# Patient Record
Sex: Male | Born: 1965 | Race: White | Hispanic: No | Marital: Married | Smoking: Never smoker
Health system: Southern US, Community
[De-identification: ages and names within clinical notes are randomized; demographics above are authoritative.]

## PROBLEM LIST (undated history)

## (undated) ENCOUNTER — Ambulatory Visit: Admission: EM | Payer: Managed Care, Other (non HMO)

## (undated) DIAGNOSIS — F32A Depression, unspecified: Secondary | ICD-10-CM

## (undated) DIAGNOSIS — F419 Anxiety disorder, unspecified: Secondary | ICD-10-CM

## (undated) DIAGNOSIS — K649 Unspecified hemorrhoids: Secondary | ICD-10-CM

## (undated) DIAGNOSIS — G4730 Sleep apnea, unspecified: Secondary | ICD-10-CM

## (undated) DIAGNOSIS — F329 Major depressive disorder, single episode, unspecified: Secondary | ICD-10-CM

## (undated) DIAGNOSIS — E785 Hyperlipidemia, unspecified: Secondary | ICD-10-CM

## (undated) DIAGNOSIS — T7840XA Allergy, unspecified, initial encounter: Secondary | ICD-10-CM

## (undated) HISTORY — DX: Hyperlipidemia, unspecified: E78.5

## (undated) HISTORY — DX: Major depressive disorder, single episode, unspecified: F32.9

## (undated) HISTORY — DX: Depression, unspecified: F32.A

## (undated) HISTORY — DX: Unspecified hemorrhoids: K64.9

## (undated) HISTORY — DX: Anxiety disorder, unspecified: F41.9

## (undated) HISTORY — DX: Allergy, unspecified, initial encounter: T78.40XA

---

## 2008-07-15 ENCOUNTER — Emergency Department: Payer: Self-pay | Admitting: Emergency Medicine

## 2015-01-30 ENCOUNTER — Telehealth: Payer: Self-pay | Admitting: Family Medicine

## 2015-01-30 NOTE — Telephone Encounter (Signed)
Patient did not answer, lvm to call back if he still needs information

## 2015-02-01 ENCOUNTER — Telehealth: Payer: Self-pay

## 2015-02-01 NOTE — Telephone Encounter (Signed)
Patient called, he had CPE in May.  Now has insurance form to have filled out.  He will come in for  Nicotine/Cotine and A1C drawn Z02.6 Patient will leave paperwork to be filled out and signed when labs have resulted  [place] to [place]

## 2015-02-11 ENCOUNTER — Other Ambulatory Visit: Payer: Self-pay

## 2015-02-13 ENCOUNTER — Other Ambulatory Visit: Payer: 59

## 2015-02-13 DIAGNOSIS — Z026 Encounter for examination for insurance purposes: Secondary | ICD-10-CM

## 2015-02-14 LAB — HGB A1C W/O EAG: HEMOGLOBIN A1C: 5.9 % — AB (ref 4.8–5.6)

## 2015-02-16 LAB — NICOTINE/COTININE METABOLITES
Cotinine: NOT DETECTED ng/mL
Nicotine: NOT DETECTED ng/mL

## 2015-02-25 ENCOUNTER — Telehealth: Payer: Self-pay

## 2015-02-25 NOTE — Telephone Encounter (Signed)
Pt requests call back about labs and paperwork he stated you were supposed to fax for his employer and insurance company. Please fax documents (Manual submission form and full lab report) to fax # [phone]. Please call pt ASAP. Pt can be reached at [phone]. Thanks.

## 2015-05-28 DIAGNOSIS — E785 Hyperlipidemia, unspecified: Secondary | ICD-10-CM | POA: Insufficient documentation

## 2015-05-29 ENCOUNTER — Other Ambulatory Visit: Payer: Self-pay | Admitting: Family Medicine

## 2015-05-29 MED ORDER — FEXOFENADINE-PSEUDOEPHED ER 60-120 MG PO TB12: 1.0000 | ORAL_TABLET | Freq: Every day | ORAL | Status: DC | PRN

## 2015-11-12 ENCOUNTER — Other Ambulatory Visit: Payer: Self-pay | Admitting: Family Medicine

## 2015-11-12 ENCOUNTER — Encounter: Payer: Self-pay | Admitting: Family Medicine

## 2015-11-12 NOTE — Telephone Encounter (Signed)
apt 

## 2015-12-12 ENCOUNTER — Encounter: Payer: Self-pay | Admitting: Family Medicine

## 2015-12-12 ENCOUNTER — Ambulatory Visit (INDEPENDENT_AMBULATORY_CARE_PROVIDER_SITE_OTHER): Payer: 59 | Admitting: Family Medicine

## 2015-12-12 VITALS — BP 109/74 | HR 71 | Temp 97.9°F | Ht 68.2 in | Wt 194.0 lb

## 2015-12-12 DIAGNOSIS — T7840XA Allergy, unspecified, initial encounter: Secondary | ICD-10-CM | POA: Insufficient documentation

## 2015-12-12 DIAGNOSIS — E785 Hyperlipidemia, unspecified: Secondary | ICD-10-CM

## 2015-12-12 DIAGNOSIS — Q828 Other specified congenital malformations of skin: Secondary | ICD-10-CM | POA: Diagnosis not present

## 2015-12-12 DIAGNOSIS — T7840XD Allergy, unspecified, subsequent encounter: Secondary | ICD-10-CM

## 2015-12-12 DIAGNOSIS — Z789 Other specified health status: Secondary | ICD-10-CM | POA: Diagnosis not present

## 2015-12-12 MED ORDER — FEXOFENADINE-PSEUDOEPHED ER 60-120 MG PO TB12: 1.0000 | ORAL_TABLET | Freq: Every day | ORAL | Status: DC | PRN

## 2015-12-12 MED ORDER — LOVASTATIN 40 MG PO TABS: 40.0000 mg | ORAL_TABLET | Freq: Every day | ORAL | Status: DC

## 2015-12-12 NOTE — Progress Notes (Signed)
BP 109/74 mmHg  Pulse 71  Temp(Src) 97.9 F (36.6 C)  Ht 5' 8.2" (1.732 m)  Wt 194 lb (87.998 kg)  BMI 29.33 kg/m2  SpO2 96%   Subjective:    Patient ID: Julian Wolfe, male    DOB: [birth year], 50 y.o.   MRN: [id number]  HPI: Julian Wolfe is a 50 y.o. male  Chief Complaint  Patient presents with  . Hyperlipidemia  . "flaky spot on back"  . skin tag on left eye  . Hernia  . wants to check PSA, A1C, Glucose and Cotinine    Wellness labs for insurance, can come back in the morning   Patient doing well taking cholesterol medicines without side effects and faithfully Allergy medicines doing well uses when necessary but needs a refill Skin tags on left eye and several on his back will scratching bleed Has occasional hernia which is been previously diagnosed that's sometimes uncomfortable but not enough to see a surgeon for at this point  Relevant past medical, surgical, family and social history reviewed and updated as indicated. Interim medical history since our last visit reviewed. Allergies and medications reviewed and updated.  Review of Systems  Constitutional: Negative.   Respiratory: Negative.   Cardiovascular: Negative.     Per HPI unless specifically indicated above     Objective:    BP 109/74 mmHg  Pulse 71  Temp(Src) 97.9 F (36.6 C)  Ht 5' 8.2" (1.732 m)  Wt 194 lb (87.998 kg)  BMI 29.33 kg/m2  SpO2 96%  Wt Readings from Last 3 Encounters:  12/12/15 194 lb (87.998 kg)  11/22/14 190 lb (86.183 kg)    Physical Exam  Constitutional: He is oriented to person, place, and time. He appears well-developed and well-nourished. No distress.  HENT:  Head: Normocephalic and atraumatic.  Right Ear: Hearing normal.  Left Ear: Hearing normal.  Nose: Nose normal.  Eyes: Conjunctivae and lids are normal. Right eye exhibits no discharge. Left eye exhibits no discharge. No scleral icterus.  Cardiovascular: Normal rate, regular rhythm and normal heart sounds.    Pulmonary/Chest: Effort normal and breath sounds normal. No respiratory distress.  Musculoskeletal: Normal range of motion.  Neurological: He is alert and oriented to person, place, and time.  Skin: Skin is intact. No rash noted.  Skin tags left eyelid and mid back both left and right destroyed with cryo-unit patient tolerated procedure well for lesions in all  Psychiatric: He has a normal mood and affect. His speech is normal and behavior is normal. Judgment and thought content normal. Cognition and memory are normal.    Results for orders placed or performed in visit on 02/13/15  Hgb A1c w/o eAG  Result Value Ref Range   Hgb A1c MFr Bld 5.9 (H) 4.8 - 5.6 %  Nicotine/cotinine metabolites  Result Value Ref Range   Nicotine None Detected ng/mL   Cotinine None Detected ng/mL      Assessment & Plan:   Problem List Items Addressed This Visit      Other   Hyperlipidemia - Primary    Checking labs apparently good control      Relevant Medications   lovastatin (MEVACOR) 40 MG tablet   Other Relevant Orders   ALT   AST   Lipid Panel w/o Chol/HDL Ratio   Allergy    Other Visit Diagnoses    Participant in health and wellness plan        Relevant Orders    PSA  Bayer DCA Hb A1c Waived    Nicotine/cotinine metabolites    Accessory skin tags        Skin tags face and back destroyed with cryo-unit for lesions        Follow up plan: Return for Physical Exam.

## 2015-12-12 NOTE — Assessment & Plan Note (Signed)
Checking labs apparently good control

## 2015-12-13 ENCOUNTER — Other Ambulatory Visit: Payer: 59

## 2015-12-13 DIAGNOSIS — E785 Hyperlipidemia, unspecified: Secondary | ICD-10-CM

## 2015-12-13 DIAGNOSIS — Z789 Other specified health status: Secondary | ICD-10-CM

## 2015-12-13 LAB — BAYER DCA HB A1C WAIVED: HB A1C (BAYER DCA - WAIVED): 5.8 %

## 2015-12-18 ENCOUNTER — Telehealth: Payer: Self-pay | Admitting: Family Medicine

## 2015-12-18 DIAGNOSIS — K4090 Unilateral inguinal hernia, without obstruction or gangrene, not specified as recurrent: Secondary | ICD-10-CM

## 2015-12-18 NOTE — Telephone Encounter (Signed)
Pt would like to discuss lab results.

## 2015-12-19 ENCOUNTER — Encounter: Payer: Self-pay | Admitting: Family Medicine

## 2015-12-19 LAB — LIPID PANEL W/O CHOL/HDL RATIO
Cholesterol, Total: 171 mg/dL (ref 100–199)
HDL: 45 mg/dL (ref >39)
LDL CALC: 113 mg/dL — AB (ref 0–99)
TRIGLYCERIDES: 67 mg/dL (ref 0–149)
VLDL Cholesterol Cal: 13 mg/dL (ref 5–40)

## 2015-12-19 LAB — NICOTINE/COTININE METABOLITES
COTININE: NOT DETECTED ng/mL
NICOTINE: NOT DETECTED ng/mL

## 2015-12-19 LAB — PSA: Prostate Specific Ag, Serum: 0.6 ng/mL (ref 0.0–4.0)

## 2015-12-19 LAB — ALT: ALT: 41 IU/L (ref 0–44)

## 2015-12-19 LAB — AST: AST: 24 IU/L (ref 0–40)

## 2015-12-19 NOTE — Telephone Encounter (Signed)
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 Patient's now ready to have hernia repaired will refer to surgery

## 2015-12-23 ENCOUNTER — Encounter: Payer: Self-pay | Admitting: *Deleted

## 2015-12-30 ENCOUNTER — Telehealth: Payer: Self-pay

## 2015-12-30 NOTE — Telephone Encounter (Signed)
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

## 2016-01-02 ENCOUNTER — Encounter: Payer: Self-pay | Admitting: General Surgery

## 2016-01-02 ENCOUNTER — Ambulatory Visit (INDEPENDENT_AMBULATORY_CARE_PROVIDER_SITE_OTHER): Payer: 59 | Admitting: General Surgery

## 2016-01-02 DIAGNOSIS — R1031 Right lower quadrant pain: Secondary | ICD-10-CM | POA: Diagnosis not present

## 2016-01-02 NOTE — Progress Notes (Signed)
Patient ID: Julian Wolfe, male   DOB: 1965-11-18, 50 y.o.   MRN: [id number]  Chief Complaint  Patient presents with  . Abdominal Pain    HPI Julian Wolfe is Wolfe 50 y.o. male.  Here today for evaluation of right lower abdominal pain. He states he has noticed the area for several years. 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  He states there is no visible bulge.   Denies any gastroenterically issues. He does states peanuts, garlic and ice cream bother him. Admits to several bowel movements in the morning time.  He is here today with his wife.   I have reviewed the history of present illness with the patient.  HPI  Past Medical History  Diagnosis Date  . Hyperlipidemia   . Allergy   . Anxiety   . Depression   . Hemorrhoid     History reviewed. No pertinent past surgical history.  Family History  Problem Relation Age of Onset  . Hypertension Mother   . Dementia Mother   . Parkinson's disease Mother   . Cancer Father 19    prostate  . Parkinson's disease Father     Social History Social History  Substance Use Topics  . Smoking status: Never Smoker   . Smokeless tobacco: Former Systems developer    Types: [place] date: 07/21/1995  . Alcohol Use: Yes     Comment: rare    No Known Allergies  Current Outpatient Prescriptions  Medication Sig Dispense Refill  . fexofenadine-pseudoephedrine (ALLEGRA-D) 60-120 MG 12 hr tablet Take 1 tablet by mouth daily as needed. 60 tablet 6  . lovastatin (MEVACOR) 40 MG tablet Take 1 tablet (40 mg total) by mouth daily. 90 tablet 1   No current facility-administered medications for this visit.    Review of Systems Review of Systems  Constitutional: Negative.   Respiratory: Negative.   Cardiovascular: Negative.   Gastrointestinal: Positive for abdominal pain.    Blood pressure 154/90, pulse 72, resp. rate 12, height  '5\' 10"'  (1.778 m), weight 198 lb (89.812 kg).  Physical Exam Physical Exam  Constitutional: He is oriented to person, place, and time. He appears well-developed and well-nourished.  HENT:  Mouth/Throat: Oropharynx is clear and moist.  Eyes: Conjunctivae are normal. No scleral icterus.  Neck: Neck supple.  Cardiovascular: Normal rate, regular rhythm and normal heart sounds.   Pulmonary/Chest: Effort normal and breath sounds normal.  Abdominal: Soft. Normal appearance and bowel sounds are normal. He exhibits no distension. There is tenderness in the right lower quadrant. Wolfe hernia is present. Hernia confirmed negative in the right inguinal area and confirmed negative in the left inguinal area.    5 mm umbilical fascial opening  Neurological: He is alert and oriented to person, place, and time.  Skin: Skin is warm and dry.  Psychiatric: His behavior is normal.    Data Reviewed Prior notes  Assessment    Abdominal pain, RLQ. No hernia noted in right inguinal area     Plan    Continue to monitor symptoms, avoid trigger foods that upset the bowel.  Recommend Wolfe CT scan with Wolfe follow-up appointment.      Patient has been scheduled for Wolfe CT abdomen/pelvis with contrast at [facility] for 01-10-16 at 8 am (arrive 7:45 am). Prep: NPO 4 hours prior and pick  up prep kit. Patient verbalizes understanding.   PCP:  Julian Wolfe This information has been scribed by Karie Fetch RN, BSN,BC.    Julian,SEEPLAPUTHUR Wolfe 01/03/2016, 9:36 AM

## 2016-01-02 NOTE — Patient Instructions (Addendum)
The patient is aware to call back for any questions or concerns.  Recommend a CT scan with a follow-up appointment.   Patient has been scheduled for a CT abdomen/pelvis with contrast at [facility] for 01-10-16 at 8 am (arrive 7:45 am). Prep: NPO 4 hours prior and pick up prep kit. Patient verbalizes understanding.

## 2016-01-03 ENCOUNTER — Encounter: Payer: Self-pay | Admitting: General Surgery

## 2016-01-10 ENCOUNTER — Ambulatory Visit
Admission: RE | Admit: 2016-01-10 | Discharge: 2016-01-10 | Disposition: A | Discharge status: Home / Self Care | Payer: 59 | Source: Ambulatory Visit | Attending: General Surgery | Admitting: General Surgery

## 2016-01-10 DIAGNOSIS — R1031 Right lower quadrant pain: Secondary | ICD-10-CM | POA: Insufficient documentation

## 2016-01-10 MED ORDER — IOPAMIDOL (ISOVUE-300) INJECTION 61%
100.0000 mL | Freq: Once | INTRAVENOUS | Status: AC | PRN
  Administered 2016-01-10: 100 mL via INTRAVENOUS

## 2016-01-27 ENCOUNTER — Encounter: Payer: Self-pay | Admitting: General Surgery

## 2016-01-27 ENCOUNTER — Ambulatory Visit (INDEPENDENT_AMBULATORY_CARE_PROVIDER_SITE_OTHER): Payer: 59 | Admitting: General Surgery

## 2016-01-27 VITALS — BP 128/76 | HR 74 | Resp 12 | Ht 70.0 in | Wt 200.0 lb

## 2016-01-27 DIAGNOSIS — R1031 Right lower quadrant pain: Secondary | ICD-10-CM | POA: Diagnosis not present

## 2016-01-27 NOTE — Progress Notes (Signed)
Patient ID: Julian Wolfe, male   DOB: [birth year], 50 y.o.   MRN: [id number] Patient is here following up from a CT abdomen pelvis scan done on 01/10/16. CT report and the images were reviewed in full  Ct scan showed no findings. He has symptoms suggestive if IBS. May benefit with GI consult.  Patient to return as needed.         PCP: Dossie ArbourCrissman This has been scribed by Sinda Duaryl-Lyn M Kennedy LPN

## 2016-01-27 NOTE — Patient Instructions (Signed)
Patient to return as needed. 

## 2016-01-28 ENCOUNTER — Encounter: Payer: Self-pay | Admitting: General Surgery

## 2016-02-05 ENCOUNTER — Telehealth: Payer: Self-pay | Admitting: Family Medicine

## 2016-02-05 DIAGNOSIS — Z5181 Encounter for therapeutic drug level monitoring: Secondary | ICD-10-CM

## 2016-02-05 DIAGNOSIS — Z1211 Encounter for screening for malignant neoplasm of colon: Secondary | ICD-10-CM | POA: Insufficient documentation

## 2016-02-05 NOTE — Telephone Encounter (Signed)
Will do!

## 2016-02-05 NOTE — Telephone Encounter (Signed)
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 If patient needs them soon he will need to come in to see another provider. Please get him scheduled or an appointment with another provider.

## 2016-02-05 NOTE — Telephone Encounter (Signed)
Pt called stated he needs to have a glucose and creatnine labs. He needs these done for his employer ASAP. Can these be ordered? Please call pt when lab orders have been added. Pt stated he has called about this 5 times. Please return call ASAP. Thanks.

## 2016-02-05 NOTE — Telephone Encounter (Signed)
Elnita MaxwellCheryl, can we enter orders for this patient to have his glucose and creatinine checked?

## 2016-02-05 NOTE — Telephone Encounter (Signed)
Called pt to inform he would need an appointment, pt stated that was not what he was told when he spoke with Harriett SineNancy and [place]an. Pt is very upset stated he needs this completed ASAP and does not understand why he needs an appt with another provider to have labs drawn. No notes found in Epic. Thanks.

## 2016-02-05 NOTE — Telephone Encounter (Signed)
Called and spoke to patient. I let him know that lab orders were entered and he could come by to have them drawn. Patient stated he would come by fasting because he was unsure if his company needed fasting results or not.

## 2016-02-06 ENCOUNTER — Other Ambulatory Visit: Payer: 59

## 2016-02-06 DIAGNOSIS — Z5181 Encounter for therapeutic drug level monitoring: Secondary | ICD-10-CM

## 2016-02-07 ENCOUNTER — Encounter: Payer: Self-pay | Admitting: Unknown Physician Specialty

## 2016-02-07 LAB — BASIC METABOLIC PANEL
BUN/Creatinine Ratio: 11 (ref 9–20)
BUN: 10 mg/dL (ref 6–24)
CALCIUM: 9 mg/dL (ref 8.7–10.2)
CHLORIDE: 102 mmol/L (ref 96–106)
CO2: 23 mmol/L (ref 18–29)
Creatinine, Ser: 0.87 mg/dL (ref 0.76–1.27)
GFR, EST AFRICAN AMERICAN: 119 mL/min/{1.73_m2} (ref >59)
GFR, EST NON AFRICAN AMERICAN: 103 mL/min/{1.73_m2} (ref >59)
Glucose: 109 mg/dL — ABNORMAL HIGH (ref 65–99)
Potassium: 4.8 mmol/L (ref 3.5–5.2)
Sodium: 139 mmol/L (ref 134–144)

## 2016-02-12 ENCOUNTER — Telehealth: Payer: Self-pay

## 2016-02-12 NOTE — Telephone Encounter (Signed)
Pt called to request that his lab results be emailed so he can release the results to his employer.  Emailing lab results to patient and advising him to activate his MyChart account.

## 2016-04-07 ENCOUNTER — Encounter (INDEPENDENT_AMBULATORY_CARE_PROVIDER_SITE_OTHER): Payer: Self-pay

## 2016-04-27 ENCOUNTER — Encounter: Payer: Self-pay | Admitting: Family Medicine

## 2016-04-27 ENCOUNTER — Ambulatory Visit (INDEPENDENT_AMBULATORY_CARE_PROVIDER_SITE_OTHER): Payer: 59 | Admitting: Family Medicine

## 2016-04-27 VITALS — BP 120/77 | HR 85 | Temp 98.0°F | Ht 67.7 in | Wt 198.6 lb

## 2016-04-27 DIAGNOSIS — E7800 Pure hypercholesterolemia, unspecified: Secondary | ICD-10-CM | POA: Diagnosis not present

## 2016-04-27 DIAGNOSIS — Z23 Encounter for immunization: Secondary | ICD-10-CM | POA: Diagnosis not present

## 2016-04-27 DIAGNOSIS — Z0000 Encounter for general adult medical examination without abnormal findings: Secondary | ICD-10-CM | POA: Diagnosis not present

## 2016-04-27 MED ORDER — LOVASTATIN 40 MG PO TABS: 40.0000 mg | ORAL_TABLET | Freq: Every day | ORAL | 4 refills | Status: DC

## 2016-04-27 MED ORDER — FEXOFENADINE-PSEUDOEPHED ER 60-120 MG PO TB12: 1.0000 | ORAL_TABLET | Freq: Every day | ORAL | 6 refills | Status: DC | PRN

## 2016-04-27 NOTE — Assessment & Plan Note (Signed)
The current medical regimen is effective;  continue present plan and medications.  

## 2016-04-27 NOTE — Progress Notes (Signed)
BP 120/77 (BP Location: Left Arm, Patient Position: Sitting, Cuff Size: Normal)   Pulse 85   Temp 98 F (36.7 C)   Ht 5' 7.7" (1.72 m)   Wt 198 lb 9.6 oz (90.1 kg)   SpO2 96%   BMI 30.47 kg/m    Subjective:    Patient ID: Julian Wolfe, male    DOB: [birth year], 50 y.o.   MRN: [id number]  HPI: Julian Wolfe is a 50 y.o. male  Chief Complaint  Patient presents with  . Annual Exam  Patient all in all doing well no complaints taking lovastatin without problems   Relevant past medical, surgical, family and social history reviewed and updated as indicated. Interim medical history since our last visit reviewed. Allergies and medications reviewed and updated.  Review of Systems  Constitutional: Negative.   HENT: Negative.   Eyes: Negative.   Respiratory: Negative.   Cardiovascular: Negative.   Gastrointestinal: Negative.   Endocrine: Negative.   Genitourinary: Negative.   Musculoskeletal: Negative.   Skin: Negative.   Allergic/Immunologic: Negative.   Neurological: Negative.   Hematological: Negative.   Psychiatric/Behavioral: Negative.     Per HPI unless specifically indicated above     Objective:    BP 120/77 (BP Location: Left Arm, Patient Position: Sitting, Cuff Size: Normal)   Pulse 85   Temp 98 F (36.7 C)   Ht 5' 7.7" (1.72 m)   Wt 198 lb 9.6 oz (90.1 kg)   SpO2 96%   BMI 30.47 kg/m   Wt Readings from Last 3 Encounters:  04/27/16 198 lb 9.6 oz (90.1 kg)  01/27/16 200 lb (90.7 kg)  01/02/16 198 lb (89.8 kg)    Physical Exam  Constitutional: He is oriented to person, place, and time. He appears well-developed and well-nourished.  HENT:  Head: Normocephalic and atraumatic.  Right Ear: External ear normal.  Left Ear: External ear normal.  Eyes: Conjunctivae and EOM are normal. Pupils are equal, round, and reactive to light.  Neck: Normal range of motion. Neck supple.  Cardiovascular: Normal rate, regular rhythm, normal heart sounds and intact  distal pulses.   Pulmonary/Chest: Effort normal and breath sounds normal.  Abdominal: Soft. Bowel sounds are normal. There is no splenomegaly or hepatomegaly.  Genitourinary: Rectum normal, prostate normal and penis normal.  Musculoskeletal: Normal range of motion.  Neurological: He is alert and oriented to person, place, and time. He has normal reflexes.  Skin: No rash noted. No erythema.  Psychiatric: He has a normal mood and affect. His behavior is normal. Judgment and thought content normal.    Results for orders placed or performed in visit on 02/06/16  Basic Metabolic Panel (BMET)  Result Value Ref Range   Glucose 109 (H) 65 - 99 mg/dL   BUN 10 6 - 24 mg/dL   Creatinine, Ser 4.690.87 0.76 - 1.27 mg/dL   GFR calc non Af Amer 103 >59 mL/min/1.73   GFR calc Af Amer 119 >59 mL/min/1.73   BUN/Creatinine Ratio 11 9 - 20   Sodium 139 134 - 144 mmol/L   Potassium 4.8 3.5 - 5.2 mmol/L   Chloride 102 96 - 106 mmol/L   CO2 23 18 - 29 mmol/L   Calcium 9.0 8.7 - 10.2 mg/dL      Assessment & Plan:   Problem List Items Addressed This Visit      Other   Hyperlipidemia    The current medical regimen is effective;  continue present plan and medications.  Relevant Medications   lovastatin (MEVACOR) 40 MG tablet    Other Visit Diagnoses    Annual physical exam    -  Primary   Relevant Orders   Lipid panel   CBC with Differential/Platelet   Comprehensive metabolic panel   PSA   TSH   Urinalysis, Routine w reflex microscopic (not at [facility])   Flu Vaccine QUAD 36+ mos PF IM (Fluarix & Fluzone Quad PF) (Completed)   Need for influenza vaccination       Relevant Orders   Flu Vaccine QUAD 36+ mos PF IM (Fluarix & Fluzone Quad PF) (Completed)       Follow up plan: Return in about 6 months (around 10/26/2016) for  Lipids, ALT, AST.

## 2016-04-28 ENCOUNTER — Ambulatory Visit (INDEPENDENT_AMBULATORY_CARE_PROVIDER_SITE_OTHER): Payer: 59

## 2016-04-28 ENCOUNTER — Other Ambulatory Visit: Payer: 59

## 2016-04-28 DIAGNOSIS — Z23 Encounter for immunization: Secondary | ICD-10-CM

## 2016-04-29 ENCOUNTER — Encounter: Payer: Self-pay | Admitting: Family Medicine

## 2016-04-29 LAB — CBC WITH DIFFERENTIAL/PLATELET
BASOS ABS: 0 10*3/uL (ref 0.0–0.2)
BASOS: 1 %
EOS (ABSOLUTE): 0.2 10*3/uL (ref 0.0–0.4)
Eos: 2 %
Hematocrit: 42.9 % (ref 37.5–51.0)
Hemoglobin: 14.8 g/dL (ref 12.6–17.7)
Immature Grans (Abs): 0 10*3/uL (ref 0.0–0.1)
Immature Granulocytes: 0 %
Lymphocytes Absolute: 1.2 10*3/uL (ref 0.7–3.1)
Lymphs: 20 %
MCH: 29.7 pg (ref 26.6–33.0)
MCHC: 34.5 g/dL (ref 31.5–35.7)
MCV: 86 fL (ref 79–97)
MONOS ABS: 0.9 10*3/uL (ref 0.1–0.9)
Monocytes: 14 %
NEUTROS ABS: 3.9 10*3/uL (ref 1.4–7.0)
NEUTROS PCT: 63 %
PLATELETS: 208 10*3/uL (ref 150–379)
RBC: 4.99 x10E6/uL (ref 4.14–5.80)
RDW: 13.2 % (ref 12.3–15.4)
WBC: 6.2 10*3/uL (ref 3.4–10.8)

## 2016-04-29 LAB — LIPID PANEL
CHOL/HDL RATIO: 3.9 ratio (ref 0.0–5.0)
CHOLESTEROL TOTAL: 176 mg/dL (ref 100–199)
HDL: 45 mg/dL (ref >39)
LDL CALC: 114 mg/dL — AB (ref 0–99)
TRIGLYCERIDES: 85 mg/dL (ref 0–149)
VLDL CHOLESTEROL CAL: 17 mg/dL (ref 5–40)

## 2016-04-29 LAB — URINALYSIS, ROUTINE W REFLEX MICROSCOPIC
Bilirubin, UA: NEGATIVE
GLUCOSE, UA: NEGATIVE
KETONES UA: NEGATIVE
LEUKOCYTES UA: NEGATIVE
Nitrite, UA: NEGATIVE
PROTEIN UA: NEGATIVE
RBC, UA: NEGATIVE
SPEC GRAV UA: 1.02 (ref 1.005–1.030)
Urobilinogen, Ur: 0.2 mg/dL (ref 0.2–1.0)
pH, UA: 7 (ref 5.0–7.5)

## 2016-04-29 LAB — COMPREHENSIVE METABOLIC PANEL
A/G RATIO: 1.8 (ref 1.2–2.2)
ALK PHOS: 58 IU/L (ref 39–117)
ALT: 30 IU/L (ref 0–44)
AST: 20 IU/L (ref 0–40)
Albumin: 4.5 g/dL (ref 3.5–5.5)
BILIRUBIN TOTAL: 1.4 mg/dL — AB (ref 0.0–1.2)
BUN/Creatinine Ratio: 12 (ref 9–20)
BUN: 11 mg/dL (ref 6–24)
CO2: 25 mmol/L (ref 18–29)
Calcium: 9.5 mg/dL (ref 8.7–10.2)
Chloride: 99 mmol/L (ref 96–106)
Creatinine, Ser: 0.92 mg/dL (ref 0.76–1.27)
GFR calc Af Amer: 113 mL/min/{1.73_m2} (ref >59)
GFR calc non Af Amer: 98 mL/min/{1.73_m2} (ref >59)
GLUCOSE: 92 mg/dL (ref 65–99)
Globulin, Total: 2.5 g/dL (ref 1.5–4.5)
POTASSIUM: 4.7 mmol/L (ref 3.5–5.2)
Sodium: 140 mmol/L (ref 134–144)
Total Protein: 7 g/dL (ref 6.0–8.5)

## 2016-04-29 LAB — TSH: TSH: 2.02 u[IU]/mL (ref 0.450–4.500)

## 2016-04-29 LAB — PSA: PROSTATE SPECIFIC AG, SERUM: 0.6 ng/mL (ref 0.0–4.0)

## 2016-10-13 ENCOUNTER — Telehealth: Payer: Self-pay | Admitting: Family Medicine

## 2016-10-13 MED ORDER — AZITHROMYCIN 250 MG PO TABS: ORAL_TABLET | ORAL | 0 refills | Status: DC

## 2016-10-13 NOTE — Telephone Encounter (Signed)
Please advise 

## 2016-10-13 NOTE — Telephone Encounter (Signed)
Pt would like to have z pac sent to [facility] [address] for sinus issues as well as a decongestant.

## 2016-10-13 NOTE — Telephone Encounter (Signed)
done

## 2016-10-14 NOTE — Telephone Encounter (Signed)
Message relayed to patient. Verbalized understanding and denied questions.   

## 2016-11-27 ENCOUNTER — Encounter: Payer: Self-pay | Admitting: Family Medicine

## 2016-11-27 ENCOUNTER — Ambulatory Visit (INDEPENDENT_AMBULATORY_CARE_PROVIDER_SITE_OTHER): Payer: 59 | Admitting: Family Medicine

## 2016-11-27 VITALS — BP 124/75 | HR 76 | Temp 98.9°F | Wt 201.0 lb

## 2016-11-27 DIAGNOSIS — L0501 Pilonidal cyst with abscess: Secondary | ICD-10-CM

## 2016-11-27 MED ORDER — DOXYCYCLINE HYCLATE 100 MG PO TABS: 100.0000 mg | ORAL_TABLET | Freq: Two times a day (BID) | ORAL | 0 refills | Status: DC

## 2016-11-27 MED ORDER — NAPROXEN 500 MG PO TABS: 500.0000 mg | ORAL_TABLET | Freq: Two times a day (BID) | ORAL | 0 refills | Status: DC

## 2016-11-27 NOTE — Progress Notes (Signed)
   BP 124/75   Pulse 76   Temp 98.9 F (37.2 C)   Wt 201 lb (91.2 kg)   SpO2 97%   BMI 30.83 kg/m    Subjective:    Patient ID: Julian Wolfe, male    DOB: May 13, 1966, 51 y.o.   MRN: [id number]  HPI: Julian Wolfe is a 51 y.o. male  Chief Complaint  Patient presents with  . Cyst    x 4 days. Draining, painful at the top of his tail bone. Had one in the past.   Patient presents with 4 day history of painful, red, swollen cyst on his tailbone at gluteal folds. Previously flared up 2 years ago, and after medication and having it drained things seemed to improve. Has been taking epsom salt baths which have been helping. Started draining yesterday. Not currently taking anything OTC. Denies fever, chills, N/V  Relevant past medical, surgical, family and social history reviewed and updated as indicated. Interim medical history since our last visit reviewed. Allergies and medications reviewed and updated.  Review of Systems  Constitutional: Negative.   Respiratory: Negative.   Cardiovascular: Negative.   Gastrointestinal: Negative.   Genitourinary: Negative.   Musculoskeletal: Negative.   Skin: Positive for color change and wound.  Neurological: Negative.   Psychiatric/Behavioral: Negative.     Per HPI unless specifically indicated above     Objective:    BP 124/75   Pulse 76   Temp 98.9 F (37.2 C)   Wt 201 lb (91.2 kg)   SpO2 97%   BMI 30.83 kg/m   Wt Readings from Last 3 Encounters:  11/27/16 201 lb (91.2 kg)  04/27/16 198 lb 9.6 oz (90.1 kg)  01/27/16 200 lb (90.7 kg)    Physical Exam  Constitutional: He is oriented to person, place, and time. He appears well-developed and well-nourished. No distress.  HENT:  Head: Atraumatic.  Eyes: Conjunctivae are normal. No scleral icterus.  Neck: Normal range of motion. Neck supple.  Cardiovascular: Normal rate and normal heart sounds.   Pulmonary/Chest: Effort normal and breath sounds normal. No respiratory  distress.  Musculoskeletal: Normal range of motion.  Neurological: He is alert and oriented to person, place, and time.  Skin: Skin is warm and dry.  Cyst between gluteal folds at tailbone. Small opening where it has been draining at lowest aspect of cyst. Tract present superiorly, and there are several pockets that are not yet fluctuant. Area red, swollen, and TTP.   Psychiatric: He has a normal mood and affect. His behavior is normal.  Nursing note and vitals reviewed.     Assessment & Plan:   Problem List Items Addressed This Visit    None    Visit Diagnoses    Pilonidal cyst with abscess    -  Primary   Will treat current flare with doxycycline, epsom salt soaks, neosporin, and warm compresses. Naproxen prn for pain. Probable surgical consult in future      Follow up plan: Return in about 1 week (around 12/04/2016) for Wound recheck.

## 2016-12-03 ENCOUNTER — Encounter: Payer: Self-pay | Admitting: Family Medicine

## 2016-12-03 ENCOUNTER — Ambulatory Visit (INDEPENDENT_AMBULATORY_CARE_PROVIDER_SITE_OTHER): Payer: 59 | Admitting: Family Medicine

## 2016-12-03 VITALS — BP 115/69 | HR 70 | Temp 98.1°F | Ht 68.0 in | Wt 202.0 lb

## 2016-12-03 DIAGNOSIS — L0591 Pilonidal cyst without abscess: Secondary | ICD-10-CM

## 2016-12-03 NOTE — Progress Notes (Signed)
   BP 115/69 (BP Location: Right Arm, Patient Position: Sitting, Cuff Size: Large)   Pulse 70   Temp 98.1 F (36.7 C)   Ht 5\' 8"  (1.727 m)   Wt 202 lb (91.6 kg)   SpO2 95%   BMI 30.71 kg/m    Subjective:    Patient ID: Julian Wolfe, male    DOB: 1966-05-01, 51 y.o.   MRN: [id number]  HPI: Julian Hecklerravis D Curd is a 51 y.o. male  Chief Complaint  Patient presents with  . Wound Check    Patient states it's better. No pain, redness, etc.   Patient presents for f/u of inflamed pilonidal cyst. Tolerating the doxycycline well, taking naproxen prn with good relief. States first the area drained bloody fluid but later on had some purulent drainage. Pain is almost resolved at this point, pt feeling much better. Still has a hard area where abscess was but otherwise no redness, swelling, or drainage. Denies fever, chills, sweats.   Relevant past medical, surgical, family and social history reviewed and updated as indicated. Interim medical history since our last visit reviewed. Allergies and medications reviewed and updated.  Review of Systems  Constitutional: Negative.   Skin: Positive for wound.   Per HPI unless specifically indicated above     Objective:    BP 115/69 (BP Location: Right Arm, Patient Position: Sitting, Cuff Size: Large)   Pulse 70   Temp 98.1 F (36.7 C)   Ht 5\' 8"  (1.727 m)   Wt 202 lb (91.6 kg)   SpO2 95%   BMI 30.71 kg/m   Wt Readings from Last 3 Encounters:  12/03/16 202 lb (91.6 kg)  11/27/16 201 lb (91.2 kg)  04/27/16 198 lb 9.6 oz (90.1 kg)    Physical Exam  Constitutional: He appears well-developed and well-nourished.  Skin: Skin is warm and dry.  Area of abscess much improved, no persisting erythema or edema. Small area of firmness remaining underneath skin.   Nursing note and vitals reviewed.     Assessment & Plan:   Problem List Items Addressed This Visit    None    Visit Diagnoses    Pilonidal cyst    -  Primary   Much improved w/ doxy  and good wound care. Complete abx, continue care as before. Discussed likelihood of recurrence, possible surgical intervention in future       Follow up plan: Return for as scheduled for CPE.

## 2016-12-03 NOTE — Patient Instructions (Signed)
Follow up if worsening or no improvement 

## 2016-12-29 ENCOUNTER — Telehealth: Payer: Self-pay | Admitting: Family Medicine

## 2016-12-29 MED ORDER — AZITHROMYCIN 250 MG PO TABS
ORAL_TABLET | ORAL | 0 refills | Status: DC
Start: 2016-12-29 — End: 2017-01-13

## 2016-12-29 NOTE — Telephone Encounter (Signed)
Phone call Patient describes classic paronychia has wedding this weekend will give a Z-Pak

## 2016-12-29 NOTE — Telephone Encounter (Signed)
Patient would like to speak to someone regarding his finger.  He has some type of problem with it around his nail.  His daughter is getting married this weekend and he is hoping Dr Dossie Arbourrissman can call in an ointment for him.  [phone]  Thank you

## 2016-12-29 NOTE — Telephone Encounter (Signed)
Patient was transferred to provider for telephone conversation.   

## 2016-12-29 NOTE — Telephone Encounter (Signed)
Call pt 

## 2016-12-29 NOTE — Telephone Encounter (Signed)
Is this something that can be done over the phone w/o being seen? Please advise.

## 2017-01-11 ENCOUNTER — Telehealth: Payer: Self-pay | Admitting: Family Medicine

## 2017-01-11 NOTE — Telephone Encounter (Signed)
Pt actually to early for CPE. Done last 04/2016. Will come in for routine f/u to discuss an issue and have form completed.

## 2017-01-11 NOTE — Telephone Encounter (Signed)
Called and spoke to patient. Advised that as a practice were have change the policy around labs done before the exam. Spoke to Laurelyn SickleKatina to verify that policy was to not do labs early, due to patient increased aptitude to cancel after having labs and due to increase difficulty having labs covered by insurance this way.  Pt adamant that he wants to get them done ahead of time.

## 2017-01-11 NOTE — Telephone Encounter (Signed)
Any labs are okay. Patient has been warned.

## 2017-01-11 NOTE — Telephone Encounter (Signed)
Patient is requesting to speak with you regarding some labs he is needing for his ins.  He is scheduled for his CPE Wed @ 230. I explained that the normal procedure was the labs were done at the time of the CPE but he is still requesting that he talk with you because of needing the orders put in before.  Thank You

## 2017-01-13 ENCOUNTER — Ambulatory Visit (INDEPENDENT_AMBULATORY_CARE_PROVIDER_SITE_OTHER): Payer: 59 | Admitting: Family Medicine

## 2017-01-13 ENCOUNTER — Telehealth: Payer: Self-pay | Admitting: Family Medicine

## 2017-01-13 ENCOUNTER — Telehealth: Payer: Self-pay | Admitting: General Surgery

## 2017-01-13 ENCOUNTER — Encounter: Payer: Self-pay | Admitting: Family Medicine

## 2017-01-13 VITALS — BP 116/78 | HR 75 | Ht 67.72 in | Wt 197.0 lb

## 2017-01-13 DIAGNOSIS — Z131 Encounter for screening for diabetes mellitus: Secondary | ICD-10-CM

## 2017-01-13 DIAGNOSIS — E7800 Pure hypercholesterolemia, unspecified: Secondary | ICD-10-CM

## 2017-01-13 DIAGNOSIS — L0501 Pilonidal cyst with abscess: Secondary | ICD-10-CM | POA: Insufficient documentation

## 2017-01-13 LAB — BAYER DCA HB A1C WAIVED: HB A1C: 5.5 % (ref <7.0)

## 2017-01-13 MED ORDER — CLOTRIMAZOLE-BETAMETHASONE 1-0.05 % EX LOTN: TOPICAL_LOTION | Freq: Two times a day (BID) | CUTANEOUS | 0 refills | Status: DC

## 2017-01-13 NOTE — Progress Notes (Signed)
BP 116/78   Pulse 75   Ht 5' 7.72" (1.72 m)   Wt 197 lb (89.4 kg)   SpO2 98%   BMI 30.20 kg/m    Subjective:    Patient ID: Julian Wolfe, male    DOB: Nov 21, 1965, 51 y.o.   MRN: [id number]  HPI: Julian Wolfe is a 51 y.o. male  Chief Complaint  Patient presents with  . Follow-up  . Complete form  Patient has lab core screening form with blood tests filled out.  Patient also bothered by recurrent pilonidal cyst last treatment was successful with antibiotics but still has some thickening and discomfort on the left side. This is been recurrent 2 different times has had spontaneously opening last time and previously was opened surgically. Patient has not had surgical excision. Now also has some occasional slight bleeding staining of his underwear. Working a lot on his tractor with hay season.  Relevant past medical, surgical, family and social history reviewed and updated as indicated. Interim medical history since our last visit reviewed. Allergies and medications reviewed and updated.  Review of Systems  Constitutional: Negative.   Respiratory: Negative.   Cardiovascular: Negative.     Per HPI unless specifically indicated above     Objective:    BP 116/78   Pulse 75   Ht 5' 7.72" (1.72 m)   Wt 197 lb (89.4 kg)   SpO2 98%   BMI 30.20 kg/m   Wt Readings from Last 3 Encounters:  01/13/17 197 lb (89.4 kg)  12/03/16 202 lb (91.6 kg)  11/27/16 201 lb (91.2 kg)    Physical Exam  Constitutional: He is oriented to person, place, and time. He appears well-developed and well-nourished.  HENT:  Head: Normocephalic and atraumatic.  Eyes: Conjunctivae and EOM are normal.  Neck: Normal range of motion.  Cardiovascular: Normal rate, regular rhythm and normal heart sounds.   Pulmonary/Chest: Effort normal and breath sounds normal.  Genitourinary:  Genitourinary Comments: Beginning of left gluteal crease area and macerated tissue in the gluteal crease.    Musculoskeletal: Normal range of motion.  Neurological: He is alert and oriented to person, place, and time.  Skin: No erythema.  Psychiatric: He has a normal mood and affect. His behavior is normal. Judgment and thought content normal.    Results for orders placed or performed in visit on 04/27/16  Lipid panel  Result Value Ref Range   Cholesterol, Total 176 100 - 199 mg/dL   Triglycerides 85 0 - 149 mg/dL   HDL 45 >40 mg/dL   VLDL Cholesterol Cal 17 5 - 40 mg/dL   LDL Calculated 981 (H) 0 - 99 mg/dL   Chol/HDL Ratio 3.9 0.0 - 5.0 ratio units  CBC with Differential/Platelet  Result Value Ref Range   WBC 6.2 3.4 - 10.8 x10E3/uL   RBC 4.99 4.14 - 5.80 x10E6/uL   Hemoglobin 14.8 12.6 - 17.7 g/dL   Hematocrit 19.1 47.8 - 51.0 %   MCV 86 79 - 97 fL   MCH 29.7 26.6 - 33.0 pg   MCHC 34.5 31.5 - 35.7 g/dL   RDW 29.5 62.1 - 30.8 %   Platelets 208 150 - 379 x10E3/uL   Neutrophils 63 Not Estab. %   Lymphs 20 Not Estab. %   Monocytes 14 Not Estab. %   Eos 2 Not Estab. %   Basos 1 Not Estab. %   Neutrophils Absolute 3.9 1.4 - 7.0 x10E3/uL   Lymphocytes Absolute 1.2 0.7 -  3.1 x10E3/uL   Monocytes Absolute 0.9 0.1 - 0.9 x10E3/uL   EOS (ABSOLUTE) 0.2 0.0 - 0.4 x10E3/uL   Basophils Absolute 0.0 0.0 - 0.2 x10E3/uL   Immature Granulocytes 0 Not Estab. %   Immature Grans (Abs) 0.0 0.0 - 0.1 x10E3/uL  Comprehensive metabolic panel  Result Value Ref Range   Glucose 92 65 - 99 mg/dL   BUN 11 6 - 24 mg/dL   Creatinine, Ser 1.610.92 0.76 - 1.27 mg/dL   GFR calc non Af Amer 98 >59 mL/min/1.73   GFR calc Af Amer 113 >59 mL/min/1.73   BUN/Creatinine Ratio 12 9 - 20   Sodium 140 134 - 144 mmol/L   Potassium 4.7 3.5 - 5.2 mmol/L   Chloride 99 96 - 106 mmol/L   CO2 25 18 - 29 mmol/L   Calcium 9.5 8.7 - 10.2 mg/dL   Total Protein 7.0 6.0 - 8.5 g/dL   Albumin 4.5 3.5 - 5.5 g/dL   Globulin, Total 2.5 1.5 - 4.5 g/dL   Albumin/Globulin Ratio 1.8 1.2 - 2.2   Bilirubin Total 1.4 (H) 0.0 - 1.2 mg/dL    Alkaline Phosphatase 58 39 - 117 IU/L   AST 20 0 - 40 IU/L   ALT 30 0 - 44 IU/L  PSA  Result Value Ref Range   Prostate Specific Ag, Serum 0.6 0.0 - 4.0 ng/mL  TSH  Result Value Ref Range   TSH 2.020 0.450 - 4.500 uIU/mL  Urinalysis, Routine w reflex microscopic (not [facility])  Result Value Ref Range   Specific Gravity, UA 1.020 1.005 - 1.030   pH, UA 7.0 5.0 - 7.5   Color, UA Yellow Yellow   Appearance Ur Clear Clear   Leukocytes, UA Negative Negative   Protein, UA Negative Negative/Trace   Glucose, UA Negative Negative   Ketones, UA Negative Negative   RBC, UA Negative Negative   Bilirubin, UA Negative Negative   Urobilinogen, Ur 0.2 0.2 - 1.0 mg/dL   Nitrite, UA Negative Negative   Microscopic Examination Comment       Assessment & Plan:   Problem List Items Addressed This Visit      Musculoskeletal and Integument   Pilonidal abscess    Patient with recurrent pilonidal abscess has had spontaneous drainage and been on antibiotics will refer to surgery to further evaluate.      Relevant Orders   Ambulatory referral to General Surgery     Other   Hyperlipidemia - Primary   Relevant Orders   Comprehensive metabolic panel   Lipid panel   Bayer DCA Hb A1c Waived    Other Visit Diagnoses    Screening for diabetes mellitus (DM)       Relevant Orders   Comprehensive metabolic panel   Bayer DCA Hb [id number] Waived     Lotrisone cream for lacerated tissue and gluteal crease  Follow up plan: Return if symptoms worsen or fail to improve, for As scheduled.

## 2017-01-13 NOTE — Telephone Encounter (Signed)
LEFT MESSAGE FOR PATIENT TO CALL AN SCHEDULE AN APPOINTMENT WITH DR Bonner PunaSANKAR(LAST SEEN 7-10-7)FOR PILONIDAL ABSCESS.REF'D BY [facility].HAS [employer] INS

## 2017-01-13 NOTE — Assessment & Plan Note (Signed)
Patient with recurrent pilonidal abscess has had spontaneous drainage and been on antibiotics will refer to surgery to further evaluate.

## 2017-01-13 NOTE — Telephone Encounter (Signed)
Patient would like to name of [facility] [place] [facility]  Please Advise.  Thank you

## 2017-01-14 ENCOUNTER — Encounter: Payer: Self-pay | Admitting: Family Medicine

## 2017-01-14 LAB — COMPREHENSIVE METABOLIC PANEL
A/G RATIO: 1.8 (ref 1.2–2.2)
ALBUMIN: 4.5 g/dL (ref 3.5–5.5)
ALT: 23 IU/L (ref 0–44)
AST: 21 IU/L (ref 0–40)
Alkaline Phosphatase: 64 IU/L (ref 39–117)
BILIRUBIN TOTAL: 0.9 mg/dL (ref 0.0–1.2)
BUN / CREAT RATIO: 13 (ref 9–20)
BUN: 10 mg/dL (ref 6–24)
CHLORIDE: 104 mmol/L (ref 96–106)
CO2: 23 mmol/L (ref 20–29)
Calcium: 9.3 mg/dL (ref 8.7–10.2)
Creatinine, Ser: 0.77 mg/dL (ref 0.76–1.27)
GFR calc non Af Amer: 107 mL/min/{1.73_m2} (ref >59)
GFR, EST AFRICAN AMERICAN: 124 mL/min/{1.73_m2} (ref >59)
Globulin, Total: 2.5 g/dL (ref 1.5–4.5)
Glucose: 87 mg/dL (ref 65–99)
POTASSIUM: 4.3 mmol/L (ref 3.5–5.2)
Sodium: 139 mmol/L (ref 134–144)
TOTAL PROTEIN: 7 g/dL (ref 6.0–8.5)

## 2017-01-14 LAB — LIPID PANEL
CHOL/HDL RATIO: 4 ratio (ref 0.0–5.0)
Cholesterol, Total: 165 mg/dL (ref 100–199)
HDL: 41 mg/dL (ref >39)
LDL Calculated: 94 mg/dL (ref 0–99)
Triglycerides: 149 mg/dL (ref 0–149)
VLDL CHOLESTEROL CAL: 30 mg/dL (ref 5–40)

## 2017-01-14 NOTE — Telephone Encounter (Signed)
[facility] Address: [address], [place], [place] [address] Phone: [phone]

## 2017-01-14 NOTE — Telephone Encounter (Signed)
Pt.notified

## 2017-01-15 ENCOUNTER — Telehealth: Payer: Self-pay | Admitting: Family Medicine

## 2017-01-15 MED ORDER — DOXYCYCLINE HYCLATE 100 MG PO TABS: 100.0000 mg | ORAL_TABLET | Freq: Two times a day (BID) | ORAL | 0 refills | Status: DC

## 2017-01-15 NOTE — Telephone Encounter (Signed)
Doxycycline sent

## 2017-01-15 NOTE — Telephone Encounter (Signed)
Patient notified

## 2017-01-15 NOTE — Telephone Encounter (Signed)
Patient is out of town and would like an antibiotic for the cyst on  Is back.    Patient would like a call back when medication is ready.  Please Advise.  Thank you

## 2017-01-15 NOTE — Telephone Encounter (Signed)
Routing to provider. See Dr. Christell Faithrissman's note from 01/13/17.

## 2017-01-28 ENCOUNTER — Ambulatory Visit: Payer: 59 | Admitting: General Surgery

## 2017-02-02 ENCOUNTER — Telehealth: Payer: Self-pay | Admitting: General Surgery

## 2017-02-02 NOTE — Telephone Encounter (Signed)
LEFT ANOTHER MESSAGE ASKING PATIENT TO CALL & SCHEDULE AN APPOINTMENT WITH DR Lafe GarinSANK.

## 2017-02-03 ENCOUNTER — Ambulatory Visit (INDEPENDENT_AMBULATORY_CARE_PROVIDER_SITE_OTHER): Payer: 59 | Admitting: General Surgery

## 2017-02-03 ENCOUNTER — Encounter: Payer: Self-pay | Admitting: General Surgery

## 2017-02-03 VITALS — BP 144/80 | HR 78 | Resp 12 | Ht 70.0 in | Wt 200.0 lb

## 2017-02-03 DIAGNOSIS — K611 Rectal abscess: Secondary | ICD-10-CM | POA: Diagnosis not present

## 2017-02-03 MED ORDER — DOXYCYCLINE HYCLATE 100 MG PO TABS: 100.0000 mg | ORAL_TABLET | Freq: Every day | ORAL | 0 refills | Status: DC

## 2017-02-03 NOTE — Progress Notes (Signed)
Patient ID: Julian Wolfe, male   DOB: [birth year], 51 y.o.   MRN: [id number]  Chief Complaint  Patient presents with  . Other    HPI Julian Wolfe is a 51 y.o. male here today for a evaluation of a pilonidal abscess. Patient states he noticed this area about two months ago, when the area went away. He noticed this on  Monday their was an area coming up. Was drained in 2017. Since here in our office on 01/03/2016 for Abdominal pain.  HPI  Past Medical History:  Diagnosis Date  . Allergy   . Anxiety   . Depression   . Hemorrhoid   . Hyperlipidemia     History reviewed. No pertinent surgical history.  Family History  Problem Relation Age of Onset  . Hypertension Mother   . Dementia Mother   . Parkinson's disease Mother   . Cancer Father [id number]       prostate  . Parkinson's disease Father     Social History Social History  Substance Use Topics  . Smoking status: Never Smoker  . Smokeless tobacco: Former NeurosurgeonUser    Types: Chew    Quit date: 07/21/1995  . Alcohol use Yes     Comment: rare    Allergies  Allergen Reactions  . Sulfur Nausea And Vomiting    Current Outpatient Prescriptions  Medication Sig Dispense Refill  . clotrimazole-betamethasone (LOTRISONE) lotion Apply topically 2 (two) times daily. 30 mL 0  . fexofenadine-pseudoephedrine (ALLEGRA-D) 60-120 MG 12 hr tablet Take 1 tablet by mouth daily as needed. 60 tablet 6  . lovastatin (MEVACOR) 40 MG tablet Take 1 tablet (40 mg total) by mouth daily. 90 tablet 4  . doxycycline (VIBRA-TABS) 100 MG tablet Take 1 tablet (100 mg total) by mouth daily. 15 tablet 0   No current facility-administered medications for this visit.     Review of Systems Review of Systems  Constitutional: Negative.   Respiratory: Negative.   Cardiovascular: Negative.     Blood pressure (!) 144/80, pulse 78, resp. rate 12, height 5\' 10"  (1.778 m), weight 200 lb (90.7 kg).  Physical Exam Physical Exam  Constitutional: He is oriented to  person, place, and time. He appears well-developed and well-nourished.  Lymphadenopathy:       Right: No inguinal adenopathy present.       Left: No inguinal adenopathy present.  Neurological: He is alert and oriented to person, place, and time.  Skin: Skin is warm and dry.       Data Reviewed Notes reviewed   Assessment    Abscess /focal infection near the upper gluteal cleft- not sure of source. Does not look like a pilonidal process.      Plan     Trial of Doxycycline 100 mg daily for 2 weeks.  Patient to keep the area clean and hair clipped. Return in one month or sooner if there is any increase in swelling and pain   HPI, Physical Exam, Assessment and Plan have been scribed under the direction and in the presence of Kathreen CosierS. G. Shawnte Winton, MD  Ples SpecterJessica Qualls, CMA    I have completed the exam and reviewed the above documentation for accuracy and completeness.  I agree with the above.  [employer]useum[employer]gallery [employer] has been used and any errors in dictation or transcription are unintentional.  Oluwademilade Mckiver G. Evette CristalSankar, M.D., F.A.C.S.   Gerlene BurdockSANKAR,Cashius Grandstaff G 02/04/2017, 9:54 AM

## 2017-02-03 NOTE — Patient Instructions (Signed)
  Patient to keep the area clean and shaved . Return in one month.

## 2017-02-10 ENCOUNTER — Telehealth: Payer: Self-pay | Admitting: Family Medicine

## 2017-02-10 NOTE — Telephone Encounter (Signed)
Form refaxed pt aware.

## 2017-02-10 NOTE — Telephone Encounter (Signed)
Patient called in regards to paperwork that was filled out in regards to his BP, measurements for his body weight and hight, as well as his lab results. Patient stated that the paperwork was needing to be singed by provider and [facility] and patient.  Patient would also like a call back when the paperwork has been signed and faxed over.   Please Advise.  Thank you

## 2017-02-10 NOTE — Telephone Encounter (Signed)
Form printed from media and placed on provider's box for signature.

## 2017-03-04 ENCOUNTER — Encounter: Payer: Self-pay | Admitting: General Surgery

## 2017-03-04 ENCOUNTER — Ambulatory Visit (INDEPENDENT_AMBULATORY_CARE_PROVIDER_SITE_OTHER): Payer: 59 | Admitting: General Surgery

## 2017-03-04 VITALS — BP 122/86 | HR 80 | Resp 16 | Ht 69.5 in | Wt 199.0 lb

## 2017-03-04 DIAGNOSIS — L729 Follicular cyst of the skin and subcutaneous tissue, unspecified: Secondary | ICD-10-CM | POA: Diagnosis not present

## 2017-03-04 NOTE — Patient Instructions (Signed)
Return to office for excision 

## 2017-03-04 NOTE — Progress Notes (Signed)
Patient ID: Julian Wolfe, male   DOB: 07/06/1966, 51 y.o.   MRN: [id number]  Chief Complaint  Patient presents with  . Follow-up    HPI Julian Wolfe is a 51 y.o. male.  Here for follow up of abscess near gluteal fold. He states the area is much better. He states the area opened up 3 days after last visit. No bleeding or pain.   HPI  Past Medical History:  Diagnosis Date  . Allergy   . Anxiety   . Depression   . Hemorrhoid   . Hyperlipidemia     No past surgical history on file.  Family History  Problem Relation Age of Onset  . Hypertension Mother   . Dementia Mother   . Parkinson's disease Mother   . Cancer Father [phone]       prostate  . Parkinson's disease Father     Social History Social History  Substance Use Topics  . Smoking status: Never Smoker  . Smokeless tobacco: Former NeurosurgeonUser    Types: Chew    Quit date: 07/21/1995  . Alcohol use Yes     Comment: rare    Allergies  Allergen Reactions  . Sulfur Nausea And Vomiting    Current Outpatient Prescriptions  Medication Sig Dispense Refill  . fexofenadine-pseudoephedrine (ALLEGRA-D) 60-120 MG 12 hr tablet Take 1 tablet by mouth daily as needed. 60 tablet 6  . lovastatin (MEVACOR) 40 MG tablet Take 1 tablet (40 mg total) by mouth daily. 90 tablet 4   No current facility-administered medications for this visit.     Review of Systems Review of Systems  Constitutional: Negative.   Respiratory: Negative.   Cardiovascular: Negative.     Blood pressure 122/86, pulse 80, resp. rate 16, height 5' 9.5" (1.765 m), weight 199 lb (90.3 kg).  Physical Exam Physical Exam  Constitutional: He is oriented to person, place, and time. He appears well-developed and well-nourished.  Neurological: He is alert and oriented to person, place, and time.  Skin: Skin is warm and dry.       Data Reviewed Prior note  Assessment    History of recurring abscess in same area. The exam suggests an area of folliculitis or a  skin cyst    Plan   Excision recommended and pt is agreeable       HPI, Physical Exam, Assessment and Plan have been scribed under the direction and in the presence of Kathreen CosierS. G. Jaide Hillenburg, MD  Ples SpecterJessica Qualls, CMA I have completed the exam and reviewed the above documentation for accuracy and completeness.  I agree with the above.  [employer]useum[employer]gallery [employer] has been used and any errors in dictation or transcription are unintentional.  Sanjith Siwek G. Evette CristalSankar, M.D., F.A.C.S.  Gerlene BurdockSANKAR,Jackolyn Geron G 03/08/2017, 7:52 AM

## 2017-03-08 ENCOUNTER — Ambulatory Visit (INDEPENDENT_AMBULATORY_CARE_PROVIDER_SITE_OTHER): Payer: 59 | Admitting: General Surgery

## 2017-03-08 ENCOUNTER — Encounter: Payer: Self-pay | Admitting: General Surgery

## 2017-03-08 VITALS — BP 142/80 | HR 82 | Resp 12 | Ht 69.5 in | Wt 199.0 lb

## 2017-03-08 DIAGNOSIS — L729 Follicular cyst of the skin and subcutaneous tissue, unspecified: Secondary | ICD-10-CM | POA: Diagnosis not present

## 2017-03-08 NOTE — Progress Notes (Signed)
Patient ID: Julian Wolfe, male   DOB: September 17, 1965, 51 y.o.   MRN: [id number]  Chief Complaint  Patient presents with  . Procedure    HPI Julian Wolfe is a 51 y.o. male here for a scheduled excision of a gluteal skin cyst.  HPI  Past Medical History:  Diagnosis Date  . Allergy   . Anxiety   . Depression   . Hemorrhoid   . Hyperlipidemia     No past surgical history on file.  Family History  Problem Relation Age of Onset  . Hypertension Mother   . Dementia Mother   . Parkinson's disease Mother   . Cancer Father 21       prostate  . Parkinson's disease Father     Social History Social History  Substance Use Topics  . Smoking status: Never Smoker  . Smokeless tobacco: Former Neurosurgeon    Types: Chew    Quit date: 07/21/1995  . Alcohol use Yes     Comment: rare    Allergies  Allergen Reactions  . Sulfur Nausea And Vomiting    Current Outpatient Prescriptions  Medication Sig Dispense Refill  . fexofenadine-pseudoephedrine (ALLEGRA-D) 60-120 MG 12 hr tablet Take 1 tablet by mouth daily as needed. 60 tablet 6  . lovastatin (MEVACOR) 40 MG tablet Take 1 tablet (40 mg total) by mouth daily. 90 tablet 4   No current facility-administered medications for this visit.     Review of Systems Review of Systems  Constitutional: Negative.   Respiratory: Negative.   Cardiovascular: Negative.     Blood pressure (!) 142/80, pulse 82, resp. rate 12, height 5' 9.5" (1.765 m), weight 199 lb (90.3 kg).  Physical Exam Physical Exam  Constitutional: He is oriented to person, place, and time. He appears well-developed and well-nourished.  Neurological: He is alert and oriented to person, place, and time.  Skin: Skin is warm and dry.  Psychiatric: He has a normal mood and affect.    Data Reviewed Past notes  Assessment    Skin cyst upper gluteal area    Plan    Procedure: excision skin cyst upper gluteal area. Intermediate closure.  Eexcised length  1.3cm  Anesthetic: 87ml 1% xylocaine mixed with 0.5% marcaine. Additional 35ml 1% xylocaine  Prep : chloro Prep  After local anesthetic and prep area covered with sterile towels.Vertical elliptical incision made. The firm mass like area was then fully excised from the subcutaneous tissue.Bleeding controlled with cautery and 2 suture ligatures of 3-0 Vicryl. Deep tissue closed with interrupted 3-0 Vicryl. Skin closed with vertical mattress sutures of 4-0 Prolene. Dressed with neosporin, gauze and tape.  No immediate problems from procedure. Advised on wound care. Suture removal in 10-14 days  Excised tissue sent for pathology    HPI, Physical Exam, Assessment and Plan have been scribed under the direction and in the presence of Kathreen Cosier, MD  Carron Brazen, LPN I have completed the exam and reviewed the above documentation for accuracy and completeness.  I agree with the above.  Museum/gallery conservator has been used and any errors in dictation or transcription are unintentional.  Seeplaputhur G. Evette Cristal, M.D., F.A.C.S.    Gerlene Burdock G 03/09/2017, 6:03 PM

## 2017-03-08 NOTE — Patient Instructions (Addendum)
You may shower tomorrow after removing your dressing. Replace afterward with Band-Aid or other covering such as gauze with tape. May use neosporin to area once daily. Return in 10 days for suture removal We will call you with the results.

## 2017-03-12 ENCOUNTER — Telehealth: Payer: Self-pay | Admitting: *Deleted

## 2017-03-12 NOTE — Telephone Encounter (Signed)
-----   Message from Kieth Brightly, MD sent at 03/12/2017  9:13 AM EDT ----- Please inform patient pathology showed a benign cyst-complete removal

## 2017-03-12 NOTE — Telephone Encounter (Signed)
Notified patient as instructed, patient pleased. Discussed follow-up appointments, patient agrees  

## 2017-03-15 ENCOUNTER — Ambulatory Visit (INDEPENDENT_AMBULATORY_CARE_PROVIDER_SITE_OTHER): Payer: 59 | Admitting: General Surgery

## 2017-03-15 ENCOUNTER — Encounter: Payer: Self-pay | Admitting: General Surgery

## 2017-03-15 VITALS — BP 120/76 | HR 72 | Resp 12 | Ht 67.0 in | Wt 199.0 lb

## 2017-03-15 DIAGNOSIS — L729 Follicular cyst of the skin and subcutaneous tissue, unspecified: Secondary | ICD-10-CM

## 2017-03-15 NOTE — Patient Instructions (Signed)
  Patient to return in two weeks. The patient is aware to call back for any questions or concerns.    

## 2017-03-15 NOTE — Progress Notes (Signed)
Patient ID: Julian Wolfe, male   DOB: [birth year], 51 y.o.   MRN: [id number]  Chief Complaint  Patient presents with  . Routine Post Op    HPI Julian Wolfe is a 51 y.o. male here today for  A check on gluteal cyst excision done on 03/08/2017. He states the area is puffy and more painful last 2-3 days. He had called over the weekend and he was started on Doxycycline that he had already. HPI  Past Medical History:  Diagnosis Date  . Allergy   . Anxiety   . Depression   . Hemorrhoid   . Hyperlipidemia     No past surgical history on file.  Family History  Problem Relation Age of Onset  . Hypertension Mother   . Dementia Mother   . Parkinson's disease Mother   . Cancer Father 58       prostate  . Parkinson's disease Father     Social History Social History  Substance Use Topics  . Smoking status: Never Smoker  . Smokeless tobacco: Former Neurosurgeon    Types: Chew    Quit date: 07/21/1995  . Alcohol use Yes     Comment: rare    Allergies  Allergen Reactions  . Sulfur Nausea And Vomiting    Current Outpatient Prescriptions  Medication Sig Dispense Refill  . fexofenadine-pseudoephedrine (ALLEGRA-D) 60-120 MG 12 hr tablet Take 1 tablet by mouth daily as needed. 60 tablet 6  . lovastatin (MEVACOR) 40 MG tablet Take 1 tablet (40 mg total) by mouth daily. 90 tablet 4   No current facility-administered medications for this visit.     Review of Systems Review of Systems  Constitutional: Negative.   Respiratory: Negative.   Cardiovascular: Negative.     Blood pressure 120/76, pulse 72, resp. rate 12, height 5\' 7"  (1.702 m), weight 199 lb (90.3 kg).  Physical Exam Physical Exam  Constitutional: He is oriented to person, place, and time. He appears well-developed and well-nourished.  Neurological: He is alert and oriented to person, place, and time.  Skin: Skin is warm and dry.  Incision site in upper left gluteal area is red and mildly indurated. No fluctuance and no  open area Sutures were removed   Data Reviewed Path- benign cyst. Pt already notified  Assessment    Infection in cyst excision site.     Plan    Continue with Doxycycline. If pain worsens to call. Advised he may develop an abscess and/or may drain spontaneously    Patient to return in two weeks. The patient is aware to call back for any questions or concerns.   HPI, Physical Exam, Assessment and Plan have been scribed under the direction and in the presence of Kathreen Cosier, MD  Ples Specter, CMA I have completed the exam and reviewed the above documentation for accuracy and completeness.  I agree with the above.  Museum/gallery conservator has been used and any errors in dictation or transcription are unintentional.  Seeplaputhur G. Evette Cristal, M.D., F.A.C.S.  Gerlene Burdock G 03/15/2017, 11:28 AM

## 2017-03-16 ENCOUNTER — Ambulatory Visit: Payer: 59 | Admitting: General Surgery

## 2017-03-18 ENCOUNTER — Ambulatory Visit: Payer: 59

## 2017-03-29 ENCOUNTER — Encounter: Payer: Self-pay | Admitting: General Surgery

## 2017-03-29 ENCOUNTER — Ambulatory Visit (INDEPENDENT_AMBULATORY_CARE_PROVIDER_SITE_OTHER): Payer: 59 | Admitting: General Surgery

## 2017-03-29 VITALS — BP 130/70 | HR 84 | Resp 14 | Ht 69.0 in | Wt 201.0 lb

## 2017-03-29 DIAGNOSIS — L729 Follicular cyst of the skin and subcutaneous tissue, unspecified: Secondary | ICD-10-CM

## 2017-03-29 NOTE — Progress Notes (Signed)
Patient ID: Julian Wolfe, male   DOB: 09/29/1965, 51 y.o.   MRN: [id number]  Chief Complaint  Patient presents with  . Follow-up    skin cyst back    HPI Julian Wolfe is a 51 y.o. male is here today for a 2 week follow up for a skin cyst excision on his gluteus done on 03/08/17. Patient states he is doing well and pain has improved since his last office visit on 8/27.  HPI  Past Medical History:  Diagnosis Date  . Allergy   . Anxiety   . Depression   . Hemorrhoid   . Hyperlipidemia     No past surgical history on file.  Family History  Problem Relation Age of Onset  . Hypertension Mother   . Dementia Mother   . Parkinson's disease Mother   . Cancer Father [phone]       prostate  . Parkinson's disease Father     Social History Social History  Substance Use Topics  . Smoking status: Never Smoker  . Smokeless tobacco: Former NeurosurgeonUser    Types: Chew    Quit date: 07/21/1995  . Alcohol use Yes     Comment: rare    Allergies  Allergen Reactions  . Sulfur Nausea And Vomiting    Current Outpatient Prescriptions  Medication Sig Dispense Refill  . fexofenadine-pseudoephedrine (ALLEGRA-D) 60-120 MG 12 hr tablet Take 1 tablet by mouth daily as needed. 60 tablet 6  . lovastatin (MEVACOR) 40 MG tablet Take 1 tablet (40 mg total) by mouth daily. 90 tablet 4   No current facility-administered medications for this visit.     Review of Systems Review of Systems  Blood pressure 130/70, pulse 84, resp. rate 14, height 5\' 9"  (1.753 m), weight 201 lb (91.2 kg).  Physical Exam Physical Exam  Constitutional: He is oriented to person, place, and time. He appears well-developed and well-nourished.  Eyes: Conjunctivae are normal.  Neurological: He is alert and oriented to person, place, and time.  Skin: Skin is warm and dry.  Incision site over gluteal cleft is well healed with no drainage. Slightly raised with scarring.     Data Reviewed Prior notes reviewed  Assessment     Skin cyst over gluteal area - post excision     Plan    Return if needed.   HPI, Physical Exam, Assessment and Plan have been scribed under the direction and in the presence of Kathreen CosierS. G. William Laske, MD.  Milas Kocherebeca Morris, CMA   .   I have completed the exam and reviewed the above documentation for accuracy and completeness.  I agree with the above.  Museum/gallery conservator[employer]agon [employer] has been used and any errors in dictation or transcription are unintentional.  Aylani Spurlock G. Evette CristalSankar, M.D., F.A.C.S.     Gerlene BurdockSANKAR,Ryheem Jay G 03/30/2017, 8:57 AM

## 2017-03-29 NOTE — Patient Instructions (Signed)
Return if needed

## 2017-04-26 ENCOUNTER — Telehealth: Payer: Self-pay | Admitting: Family Medicine

## 2017-04-26 ENCOUNTER — Ambulatory Visit: Payer: 59

## 2017-04-26 NOTE — Telephone Encounter (Signed)
Patient would like to see if he could get his height changed on wellness form that was sent faxed in to a company [place]. Patient was unsure of what form as called but states that the hight measurement on paperwork is incorrect and needing the correct hight. Patient has appt at 2:00 pm but would like to speak with someone before appt to see if it can get handled instead of coming in for appt.   Please Advise.  Thank you

## 2017-04-26 NOTE — Telephone Encounter (Signed)
Pt can come in for a nurse visit only for height recheck and we can then change form and refax.

## 2017-04-26 NOTE — Telephone Encounter (Signed)
Called patient to inform. No voicemail set up.

## 2017-04-26 NOTE — Telephone Encounter (Signed)
Spoke with patient and informed him form will be reprinted and faxed. Also changed pt's visit to nurse visit for height recheck at 2:00 pm.

## 2017-04-26 NOTE — Telephone Encounter (Signed)
We do have a scanned version of the form that we can print and update and refax. He will just need to come in for nurse visit for height check.

## 2017-04-26 NOTE — Telephone Encounter (Signed)
Patient called again to ask if we had a copy of the form in our files that was filled out in June per patient he does not have the form. He has an appt @ 2 and does not feel the need to come if we do not have a copy of the form.   Please advise.   Thanks  [phone]

## 2017-04-26 NOTE — Telephone Encounter (Signed)
Pt came and was height was recheck, form corrected copy made for patient and form was refaxed.

## 2017-05-17 ENCOUNTER — Other Ambulatory Visit: Payer: Self-pay | Admitting: Family Medicine

## 2017-05-17 DIAGNOSIS — E7800 Pure hypercholesterolemia, unspecified: Secondary | ICD-10-CM

## 2017-05-18 ENCOUNTER — Ambulatory Visit (INDEPENDENT_AMBULATORY_CARE_PROVIDER_SITE_OTHER): Payer: 59 | Admitting: Family Medicine

## 2017-05-18 DIAGNOSIS — Z23 Encounter for immunization: Secondary | ICD-10-CM | POA: Diagnosis not present

## 2017-05-18 DIAGNOSIS — E7800 Pure hypercholesterolemia, unspecified: Secondary | ICD-10-CM

## 2017-05-18 DIAGNOSIS — Z0000 Encounter for general adult medical examination without abnormal findings: Secondary | ICD-10-CM

## 2017-05-18 MED ORDER — FEXOFENADINE-PSEUDOEPHED ER 60-120 MG PO TB12
1.0000 | ORAL_TABLET | Freq: Every day | ORAL | 6 refills | Status: DC | PRN
Start: 2017-05-18 — End: 2018-05-25

## 2017-05-18 MED ORDER — LOVASTATIN 40 MG PO TABS: 40.0000 mg | ORAL_TABLET | Freq: Every day | ORAL | 4 refills | Status: DC

## 2017-05-18 NOTE — Progress Notes (Signed)
There were no vitals taken for this visit.   Subjective:    Patient ID: Julian Wolfe, male    DOB: 1966/02/25, 51 y.o.   MRN: [id number]  HPI: Julian Wolfe is a 51 y.o. male  Chief Complaint  Patient presents with  . Annual Exam  Patient all in all doing well no complaints. Takes cholesterol medicine lovastatin without problems. Takes Allegra and Sudafed on a when necessary basis seasonally without problems. Patient's sister top of his gluteal crease has resolved after surgical excision. 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  Relevant past medical, surgical, family and social history reviewed and updated as indicated. Interim medical history since our last visit reviewed. Allergies and medications reviewed and updated.  Review of Systems  Constitutional: Negative.   HENT: Negative.   Eyes: Negative.   Respiratory: Negative.   Cardiovascular: Negative.   Gastrointestinal: Negative.   Endocrine: Negative.   Genitourinary: Negative.   Musculoskeletal: Negative.   Skin: Negative.   Allergic/Immunologic: Negative.   Neurological: Negative.   Hematological: Negative.   Psychiatric/Behavioral: Negative.     Per HPI unless specifically indicated above     Objective:    There were no vitals taken for this visit.  Wt Readings from Last 3 Encounters:  03/29/17 201 lb (91.2 kg)  03/15/17 199 lb (90.3 kg)  03/08/17 199 lb (90.3 kg)    Physical Exam  Results for orders placed or performed in visit on 01/13/17  Comprehensive metabolic panel  Result Value Ref Range   Glucose 87 65 - 99 mg/dL   BUN 10 6 - 24 mg/dL   Creatinine, Ser 4.09 0.76 - 1.27 mg/dL   GFR calc non Af Amer 107 >59 mL/min/1.73   GFR calc Af Amer 124 >59 mL/min/1.73   BUN/Creatinine Ratio 13 9 - 20   Sodium 139 134 - 144 mmol/L   Potassium 4.3 3.5 - 5.2 mmol/L   Chloride 104 96 - 106 mmol/L   CO2 23 20 - 29 mmol/L   Calcium 9.3 8.7 - 10.2 mg/dL   Total Protein 7.0 6.0 - 8.5  g/dL   Albumin 4.5 3.5 - 5.5 g/dL   Globulin, Total 2.5 1.5 - 4.5 g/dL   Albumin/Globulin Ratio 1.8 1.2 - 2.2   Bilirubin Total 0.9 0.0 - 1.2 mg/dL   Alkaline Phosphatase 64 39 - 117 IU/L   AST 21 0 - 40 IU/L   ALT 23 0 - 44 IU/L  Lipid panel  Result Value Ref Range   Cholesterol, Total 165 100 - 199 mg/dL   Triglycerides 811 0 - 149 mg/dL   HDL 41 >91 mg/dL   VLDL Cholesterol Cal 30 5 - 40 mg/dL   LDL Calculated 94 0 - 99 mg/dL   Chol/HDL Ratio 4.0 0.0 - 5.0 ratio  Bayer DCA Hb A1c Waived  Result Value Ref Range   Bayer DCA Hb A1c Waived 5.5 <7.0 %      Assessment & Plan:   Problem List Items Addressed This Visit      Other   Hyperlipidemia - Primary   Relevant Medications   lovastatin (MEVACOR) 40 MG tablet   Other Relevant Orders   Lipid panel    Other Visit Diagnoses    Needs flu shot       Relevant Orders   Flu Vaccine QUAD 36+ mos IM   PE (physical exam), annual       Relevant Orders   CBC with Differential/Platelet  Lipid panel   Comprehensive metabolic panel   PSA   TSH   Urinalysis, Routine w reflex microscopic   Uric acid       Follow up plan: Return in about 6 months (around 11/16/2017) for BMP,  Lipids, ALT, AST.

## 2017-05-19 ENCOUNTER — Encounter: Payer: Self-pay | Admitting: Family Medicine

## 2017-05-19 LAB — URINALYSIS, ROUTINE W REFLEX MICROSCOPIC
Bilirubin, UA: NEGATIVE
GLUCOSE, UA: NEGATIVE
Ketones, UA: NEGATIVE
Leukocytes, UA: NEGATIVE
NITRITE UA: NEGATIVE
PH UA: 8.5 — AB (ref 5.0–7.5)
RBC, UA: NEGATIVE
Specific Gravity, UA: 1.015 (ref 1.005–1.030)
Urobilinogen, Ur: 0.2 mg/dL (ref 0.2–1.0)

## 2017-05-19 LAB — URIC ACID: Uric Acid: 5.5 mg/dL (ref 3.7–8.6)

## 2017-05-19 LAB — CBC WITH DIFFERENTIAL/PLATELET
BASOS ABS: 0 10*3/uL (ref 0.0–0.2)
BASOS: 1 %
EOS (ABSOLUTE): 0.1 10*3/uL (ref 0.0–0.4)
Eos: 3 %
Hematocrit: 44.1 % (ref 37.5–51.0)
Hemoglobin: 15.2 g/dL (ref 13.0–17.7)
IMMATURE GRANS (ABS): 0 10*3/uL (ref 0.0–0.1)
Immature Granulocytes: 0 %
LYMPHS ABS: 1.7 10*3/uL (ref 0.7–3.1)
LYMPHS: 34 %
MCH: 30.2 pg (ref 26.6–33.0)
MCHC: 34.5 g/dL (ref 31.5–35.7)
MCV: 88 fL (ref 79–97)
Monocytes Absolute: 0.7 10*3/uL (ref 0.1–0.9)
Monocytes: 13 %
NEUTROS ABS: 2.6 10*3/uL (ref 1.4–7.0)
Neutrophils: 49 %
PLATELETS: 216 10*3/uL (ref 150–379)
RBC: 5.04 x10E6/uL (ref 4.14–5.80)
RDW: 13.8 % (ref 12.3–15.4)
WBC: 5.1 10*3/uL (ref 3.4–10.8)

## 2017-05-19 LAB — COMPREHENSIVE METABOLIC PANEL
A/G RATIO: 1.8 (ref 1.2–2.2)
ALBUMIN: 4.5 g/dL (ref 3.5–5.5)
ALK PHOS: 62 IU/L (ref 39–117)
ALT: 28 IU/L (ref 0–44)
AST: 24 IU/L (ref 0–40)
BUN / CREAT RATIO: 13 (ref 9–20)
BUN: 11 mg/dL (ref 6–24)
Bilirubin Total: 1 mg/dL (ref 0.0–1.2)
CALCIUM: 9.6 mg/dL (ref 8.7–10.2)
CO2: 27 mmol/L (ref 20–29)
CREATININE: 0.85 mg/dL (ref 0.76–1.27)
Chloride: 102 mmol/L (ref 96–106)
GFR calc Af Amer: 118 mL/min/{1.73_m2} (ref >59)
GFR, EST NON AFRICAN AMERICAN: 102 mL/min/{1.73_m2} (ref >59)
GLOBULIN, TOTAL: 2.5 g/dL (ref 1.5–4.5)
Glucose: 87 mg/dL (ref 65–99)
POTASSIUM: 4.6 mmol/L (ref 3.5–5.2)
SODIUM: 140 mmol/L (ref 134–144)
Total Protein: 7 g/dL (ref 6.0–8.5)

## 2017-05-19 LAB — LIPID PANEL
CHOLESTEROL TOTAL: 159 mg/dL (ref 100–199)
Chol/HDL Ratio: 3.7 ratio (ref 0.0–5.0)
HDL: 43 mg/dL (ref >39)
LDL Calculated: 104 mg/dL — ABNORMAL HIGH (ref 0–99)
Triglycerides: 59 mg/dL (ref 0–149)
VLDL CHOLESTEROL CAL: 12 mg/dL (ref 5–40)

## 2017-05-19 LAB — TSH: TSH: 2.34 u[IU]/mL (ref 0.450–4.500)

## 2017-05-19 LAB — MICROSCOPIC EXAMINATION

## 2017-05-19 LAB — PSA: Prostate Specific Ag, Serum: 0.6 ng/mL (ref 0.0–4.0)

## 2017-09-21 ENCOUNTER — Ambulatory Visit: Payer: Self-pay | Admitting: Family Medicine

## 2017-11-18 ENCOUNTER — Encounter: Payer: Self-pay | Admitting: Family Medicine

## 2017-11-18 ENCOUNTER — Ambulatory Visit (INDEPENDENT_AMBULATORY_CARE_PROVIDER_SITE_OTHER): Payer: Managed Care, Other (non HMO) | Admitting: Family Medicine

## 2017-11-18 VITALS — BP 110/67 | HR 74 | Ht 69.0 in | Wt 202.0 lb

## 2017-11-18 DIAGNOSIS — T7840XD Allergy, unspecified, subsequent encounter: Secondary | ICD-10-CM | POA: Diagnosis not present

## 2017-11-18 DIAGNOSIS — Z5181 Encounter for therapeutic drug level monitoring: Secondary | ICD-10-CM | POA: Diagnosis not present

## 2017-11-18 DIAGNOSIS — E785 Hyperlipidemia, unspecified: Secondary | ICD-10-CM

## 2017-11-18 LAB — LP+ALT+AST PICCOLO, WAIVED
ALT (SGPT) Piccolo, Waived: 39 U/L (ref 10–47)
AST (SGOT) PICCOLO, WAIVED: 29 U/L (ref 11–38)
CHOLESTEROL PICCOLO, WAIVED: 195 mg/dL (ref <200)
Chol/HDL Ratio Piccolo,Waive: 3.9 mg/dL
HDL CHOL PICCOLO, WAIVED: 50 mg/dL — AB (ref >59)
LDL Chol Calc Piccolo Waived: 129 mg/dL — ABNORMAL HIGH (ref <100)
Triglycerides Piccolo,Waived: 87 mg/dL (ref <150)
VLDL Chol Calc Piccolo,Waive: 17 mg/dL (ref <30)

## 2017-11-18 NOTE — Assessment & Plan Note (Signed)
The current medical regimen is effective;  continue present plan and medications.  

## 2017-11-18 NOTE — Progress Notes (Signed)
BP 110/67   Pulse 74   Ht  (1.753 m)   Wt 202 lb (91.6 kg)   SpO2 98%   BMI 29.83 kg/m    Subjective:    Patient ID: Julian Wolfe, male    DOB: 19-Aug-1965, 52 y.o.   MRN: [id number]  HPI: Julian Wolfe is a 52 y.o. male  Chief Complaint  Patient presents with  . Follow-up  . Hyperlipidemia  Patient doing well with lovastatin no issues. Allergies also doing well taking Allegra-D and doing okay. jammed finger getting better Right lower abdomen have some pulled muscle type sensation no bulging is been ongoing for a couple months off and on not bothersome right now has not noticed any bulging and was checked last year for hernia with no hernia identified other than umbilical hernia.  Relevant past medical, surgical, family and social history reviewed and updated as indicated. Interim medical history since our last visit reviewed. Allergies and medications reviewed and updated.  Review of Systems  Constitutional: Negative.   Respiratory: Negative.   Cardiovascular: Negative.     Per HPI unless specifically indicated above     Objective:    BP 110/67   Pulse 74   Ht  (1.753 m)   Wt 202 lb (91.6 kg)   SpO2 98%   BMI 29.83 kg/m   Wt Readings from Last 3 Encounters:  11/18/17 202 lb (91.6 kg)  03/29/17 201 lb (91.2 kg)  03/15/17 199 lb (90.3 kg)    Physical Exam  Constitutional: He is oriented to person, place, and time. He appears well-developed and well-nourished.  HENT:  Head: Normocephalic and atraumatic.  Eyes: Conjunctivae and EOM are normal.  Neck: Normal range of motion.  Cardiovascular: Normal rate, regular rhythm and normal heart sounds.  Pulmonary/Chest: Effort normal and breath sounds normal.  Genitourinary:  Genitourinary Comments: No hernia  Musculoskeletal: Normal range of motion.  Neurological: He is alert and oriented to person, place, and time.  Skin: No erythema.  Psychiatric: He has a normal mood and affect. His behavior is  normal. Judgment and thought content normal.    Results for orders placed or performed in visit on 05/18/17  Microscopic Examination  Result Value Ref Range   WBC, UA 0-5 0 - 5 /hpf   RBC, UA 0-2 0 - 2 /hpf   Epithelial Cells (non renal) CANCELED   CBC with Differential/Platelet  Result Value Ref Range   WBC 5.1 3.4 - 10.8 x10E3/uL   RBC 5.04 4.14 - 5.80 x10E6/uL   Hemoglobin 15.2 13.0 - 17.7 g/dL   Hematocrit 56.2 13.0 - 51.0 %   MCV 88 79 - 97 fL   MCH 30.2 26.6 - 33.0 pg   MCHC 34.5 31.5 - 35.7 g/dL   RDW 86.5 78.4 - 69.6 %   Platelets 216 150 - 379 x10E3/uL   Neutrophils 49 Not Estab. %   Lymphs 34 Not Estab. %   Monocytes 13 Not Estab. %   Eos 3 Not Estab. %   Basos 1 Not Estab. %   Neutrophils Absolute 2.6 1.4 - 7.0 x10E3/uL   Lymphocytes Absolute 1.7 0.7 - 3.1 x10E3/uL   Monocytes Absolute 0.7 0.1 - 0.9 x10E3/uL   EOS (ABSOLUTE) 0.1 0.0 - 0.4 x10E3/uL   Basophils Absolute 0.0 0.0 - 0.2 x10E3/uL   Immature Granulocytes 0 Not Estab. %   Immature Grans (Abs) 0.0 0.0 - 0.1 x10E3/uL  Lipid panel  Result Value Ref Range  Cholesterol, Total 159 100 - 199 mg/dL   Triglycerides 59 0 - 149 mg/dL   HDL 43 >16 mg/dL   VLDL Cholesterol Cal 12 5 - 40 mg/dL   LDL Calculated 109 (H) 0 - 99 mg/dL   Chol/HDL Ratio 3.7 0.0 - 5.0 ratio  Comprehensive metabolic panel  Result Value Ref Range   Glucose 87 65 - 99 mg/dL   BUN 11 6 - 24 mg/dL   Creatinine, Ser 6.04 0.76 - 1.27 mg/dL   GFR calc non Af Amer 102 >59 mL/min/1.73   GFR calc Af Amer 118 >59 mL/min/1.73   BUN/Creatinine Ratio 13 9 - 20   Sodium 140 134 - 144 mmol/L   Potassium 4.6 3.5 - 5.2 mmol/L   Chloride 102 96 - 106 mmol/L   CO2 27 20 - 29 mmol/L   Calcium 9.6 8.7 - 10.2 mg/dL   Total Protein 7.0 6.0 - 8.5 g/dL   Albumin 4.5 3.5 - 5.5 g/dL   Globulin, Total 2.5 1.5 - 4.5 g/dL   Albumin/Globulin Ratio 1.8 1.2 - 2.2   Bilirubin Total 1.0 0.0 - 1.2 mg/dL   Alkaline Phosphatase 62 39 - 117 IU/L   AST 24 0 - 40  IU/L   ALT 28 0 - 44 IU/L  PSA  Result Value Ref Range   Prostate Specific Ag, Serum 0.6 0.0 - 4.0 ng/mL  TSH  Result Value Ref Range   TSH 2.340 0.450 - 4.500 uIU/mL  Urinalysis, Routine w reflex microscopic  Result Value Ref Range   Specific Gravity, UA 1.015 1.005 - 1.030   pH, UA 8.5 (H) 5.0 - 7.5   Color, UA Yellow Yellow   Appearance Ur Clear Clear   Leukocytes, UA Negative Negative   Protein, UA 1+ (A) Negative/Trace   Glucose, UA Negative Negative   Ketones, UA Negative Negative   RBC, UA Negative Negative   Bilirubin, UA Negative Negative   Urobilinogen, Ur 0.2 0.2 - 1.0 mg/dL   Nitrite, UA Negative Negative   Microscopic Examination See below:   Uric acid  Result Value Ref Range   Uric Acid 5.5 3.7 - 8.6 mg/dL      Assessment & Plan:   Problem List Items Addressed This Visit      Other   Hyperlipidemia - Primary    The current medical regimen is effective;  continue present plan and medications.       Relevant Orders   Basic metabolic panel   LP+ALT+AST Piccolo, Waived   Allergy    The current medical regimen is effective;  continue present plan and medications.       Medication monitoring encounter   Relevant Orders   Basic metabolic panel     obs abd and finger   Follow up plan: Return in about 6 months (around 05/21/2018) for Physical Exam.

## 2017-11-19 LAB — BASIC METABOLIC PANEL
BUN/Creatinine Ratio: 13 (ref 9–20)
BUN: 11 mg/dL (ref 6–24)
CO2: 23 mmol/L (ref 20–29)
Calcium: 9 mg/dL (ref 8.7–10.2)
Chloride: 102 mmol/L (ref 96–106)
Creatinine, Ser: 0.86 mg/dL (ref 0.76–1.27)
GFR calc Af Amer: 118 mL/min/{1.73_m2} (ref >59)
GFR, EST NON AFRICAN AMERICAN: 102 mL/min/{1.73_m2} (ref >59)
GLUCOSE: 92 mg/dL (ref 65–99)
POTASSIUM: 4.1 mmol/L (ref 3.5–5.2)
SODIUM: 140 mmol/L (ref 134–144)

## 2017-11-22 ENCOUNTER — Encounter: Payer: Self-pay | Admitting: Family Medicine

## 2017-11-23 ENCOUNTER — Ambulatory Visit: Payer: Self-pay | Admitting: *Deleted

## 2017-11-23 NOTE — Telephone Encounter (Signed)
Pt. Called about lab results, Can not get mychart to work, wants nurse to call with results   Reason for Disposition . Caller requesting lab results  Answer Assessment - Initial Assessment Questions 1. REASON FOR CALL or QUESTION: "What is your reason for calling today?" or "How can I best help you?" or "What question do you have that I can help answer?"     Patient is requesting lab results- not released to MyChart  2. CALLER: Document the source of call. (e.g., laboratory, patient).     patient  Protocols used: PCP CALL - NO TRIAGE-A-AH

## 2017-11-24 NOTE — Telephone Encounter (Signed)
Left message on machine for pt to return call to the office.  

## 2018-02-17 ENCOUNTER — Encounter: Payer: Self-pay | Admitting: Physician Assistant

## 2018-02-17 ENCOUNTER — Ambulatory Visit (INDEPENDENT_AMBULATORY_CARE_PROVIDER_SITE_OTHER): Payer: Managed Care, Other (non HMO) | Admitting: Physician Assistant

## 2018-02-17 VITALS — BP 126/67 | HR 79 | Ht 69.25 in | Wt 203.0 lb

## 2018-02-17 DIAGNOSIS — L600 Ingrowing nail: Secondary | ICD-10-CM | POA: Diagnosis not present

## 2018-02-17 DIAGNOSIS — Z0000 Encounter for general adult medical examination without abnormal findings: Secondary | ICD-10-CM | POA: Diagnosis not present

## 2018-02-17 DIAGNOSIS — J3489 Other specified disorders of nose and nasal sinuses: Secondary | ICD-10-CM | POA: Diagnosis not present

## 2018-02-17 MED ORDER — MUPIROCIN 2 % EX OINT: 1.0000 "application " | TOPICAL_OINTMENT | Freq: Two times a day (BID) | CUTANEOUS | 0 refills | Status: DC

## 2018-02-17 MED ORDER — MUPIROCIN CALCIUM 2 % EX CREA: 1.0000 "application " | TOPICAL_CREAM | Freq: Two times a day (BID) | CUTANEOUS | 0 refills | Status: DC

## 2018-02-17 NOTE — Progress Notes (Signed)
Subjective:    Patient ID: Julian Wolfe, male    DOB: Jul 04, 1966, 52 y.o.   MRN: [id number]  Julian Wolfe is a 52 y.o. 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 Living in [place]. Wife - married 28 years Children: ages 75 and 77 - healthy, oldest IT trainer, youngest nursing - NP. Has biometric form to be completed pending labwork.  Reports a pimple like lesion on inside of right nostril that occurs once every 2 months and is very painful. No bleeding or drainage, no issues breathing.  Also reports left ingrown toenail on his great toe that he cut but believes is infected. He has tried some warm salt water soaks which made it better for a while but redness and swelling has returned. He reports pain associated with this, no drainage or difficulties walking. He has seen podiatry in the distant past, saw Dr. Al Corpus.   Past Medical History:  Diagnosis Date  . Allergy   . Anxiety   . Depression   . Hemorrhoid   . Hyperlipidemia    No past surgical history on file. Social History   Socioeconomic History  . Marital status: Married    Spouse name: Not on file  . Number of children: Not on file  . Years of education: Not on file  . Highest education level: Not on file  Occupational History  . Not on file  Social Needs  . Financial resource strain: Not on file  . Food insecurity:    Worry: Not on file    Inability: Not on file  . Transportation needs:    Medical: Not on file    Non-medical: Not on file  Tobacco Use  . Smoking status: Never Smoker  . Smokeless tobacco: Former Neurosurgeon    Types: Chew  Substance and Sexual Activity  . Alcohol use: Yes    Comment: rare  . Drug use: No  . Sexual activity: Not on file  Lifestyle  . Physical activity:    Days per week: Not on file    Minutes per session: Not on file  . Stress: Not on file  Relationships    . Social connections:    Talks on phone: Not on file    Gets together: Not on file    Attends religious service: Not on file    Active member of club or organization: Not on file    Attends meetings of clubs or organizations: Not on file    Relationship status: Not on file  . Intimate partner violence:    Fear of current or ex partner: Not on file    Emotionally abused: Not on file    Physically abused: Not on file    Forced sexual activity: Not on file  Other Topics Concern  . Not on file  Social History Narrative  . Not on file   Family History  Problem Relation Age of Onset  . Hypertension Mother   . Dementia Mother   . Parkinson's disease Mother   . Cancer Father 63       prostate  . Parkinson's disease Father    Current Outpatient Medications on File Prior to Visit  Medication Sig  . fexofenadine-pseudoephedrine (ALLEGRA-D) 60-120 MG 12 hr tablet Take 1 tablet by mouth daily as needed.  . lovastatin (MEVACOR) 40 MG tablet Take 1 tablet (40 mg total)  by mouth daily.   No current facility-administered medications on file prior to visit.     Review of Systems Per HPI unless specifically indicated above     Objective:    BP 126/67   Pulse 79   Ht 5' 9.25" (1.759 m)   Wt 203 lb (92.1 kg)   SpO2 98%   BMI 29.76 kg/m   Wt Readings from Last 3 Encounters:  02/17/18 203 lb (92.1 kg)  11/18/17 202 lb (91.6 kg)  03/29/17 201 lb (91.2 kg)    Physical Exam  Constitutional: He is oriented to person, place, and time. He appears well-developed and well-nourished.  HENT:  There is some swelling of the nasal turbinate but difficult to identify discrete lesion in right nostril.   Cardiovascular: Normal rate.  Pulmonary/Chest: Effort normal and breath sounds normal.  Musculoskeletal:       Feet:  Neurological: He is alert and oriented to person, place, and time.   Results for orders placed or performed in visit on 11/18/17  Basic metabolic panel  Result Value Ref  Range   Glucose 92 65 - 99 mg/dL   BUN 11 6 - 24 mg/dL   Creatinine, Ser 1.610.86 0.76 - 1.27 mg/dL   GFR calc non Af Amer 102 >59 mL/min/1.73   GFR calc Af Amer 118 >59 mL/min/1.73   BUN/Creatinine Ratio 13 9 - 20   Sodium 140 134 - 144 mmol/L   Potassium 4.1 3.5 - 5.2 mmol/L   Chloride 102 96 - 106 mmol/L   CO2 23 20 - 29 mmol/L   Calcium 9.0 8.7 - 10.2 mg/dL  LP+ALT+AST Piccolo, Waived  Result Value Ref Range   ALT (SGPT) Piccolo, Waived 39 10 - 47 U/L   AST (SGOT) Piccolo, Waived 29 11 - 38 U/L   Cholesterol Piccolo, Waived 195 <200 mg/dL   HDL Chol Piccolo, Waived 50 (L) >59 mg/dL   Triglycerides Piccolo,Waived 87 <150 mg/dL   Chol/HDL Ratio Piccolo,Waive 3.9 mg/dL   LDL Chol Calc Piccolo Waived 129 (H) <100 mg/dL   VLDL Chol Calc Piccolo,Waive 17 <30 mg/dL      Assessment & Plan:  1. Annual physical exam  Will complete form pending labwork and fax.   - Comprehensive Metabolic Panel (CMET) - Lipid Profile - HgB A1c - Measles/Mumps/Rubella Immunity - HIV antibody (with reflex)  2. Ingrown left big toenail  Ingrown toenail with associated paronychia. Recommend continuing warm salt water soaks and applying abx cream twice daily. Ask pharmacist if these creams/ointments are interchangeable and then he may get only one for both toe and nostril lesion.   - mupirocin cream (BACTROBAN) 2 %; Apply 1 application topically 2 (two) times daily.  Dispense: 15 g; Refill: 0  3. Lesion of nose  See above.   - mupirocin ointment (BACTROBAN) 2 %; Place 1 application into the nose 2 (two) times daily.  Dispense: 22 g; Refill: 0    Follow up plan: Return in about 1 year (around 02/18/2019) for CPE.  Osvaldo AngstAdriana Pollak, PA-C [facility]  [facility] 02/17/2018, 10:05 AM

## 2018-02-18 LAB — COMPREHENSIVE METABOLIC PANEL
ALT: 37 IU/L (ref 0–44)
AST: 27 IU/L (ref 0–40)
Albumin/Globulin Ratio: 1.9 (ref 1.2–2.2)
Albumin: 4.3 g/dL (ref 3.5–5.5)
Alkaline Phosphatase: 60 IU/L (ref 39–117)
BUN/Creatinine Ratio: 13 (ref 9–20)
BUN: 10 mg/dL (ref 6–24)
Bilirubin Total: 0.6 mg/dL (ref 0.0–1.2)
CO2: 23 mmol/L (ref 20–29)
Calcium: 9.2 mg/dL (ref 8.7–10.2)
Chloride: 104 mmol/L (ref 96–106)
Creatinine, Ser: 0.75 mg/dL — ABNORMAL LOW (ref 0.76–1.27)
GFR calc Af Amer: 125 mL/min/{1.73_m2} (ref >59)
GFR calc non Af Amer: 108 mL/min/{1.73_m2} (ref >59)
Globulin, Total: 2.3 g/dL (ref 1.5–4.5)
Glucose: 87 mg/dL (ref 65–99)
Potassium: 4.3 mmol/L (ref 3.5–5.2)
Sodium: 139 mmol/L (ref 134–144)
Total Protein: 6.6 g/dL (ref 6.0–8.5)

## 2018-02-18 LAB — HEMOGLOBIN A1C
Est. average glucose Bld gHb Est-mCnc: 114 mg/dL
Hgb A1c MFr Bld: 5.6 % (ref 4.8–5.6)

## 2018-02-18 LAB — LIPID PANEL
Chol/HDL Ratio: 4.1 ratio (ref 0.0–5.0)
Cholesterol, Total: 182 mg/dL (ref 100–199)
HDL: 44 mg/dL (ref >39)
LDL Calculated: 122 mg/dL — ABNORMAL HIGH (ref 0–99)
Triglycerides: 79 mg/dL (ref 0–149)
VLDL Cholesterol Cal: 16 mg/dL (ref 5–40)

## 2018-02-18 LAB — MEASLES/MUMPS/RUBELLA IMMUNITY
MUMPS ABS, IGG: 76.5 AU/mL (ref >10.9)
RUBEOLA AB, IGG: <25 AU/mL — ABNORMAL LOW (ref >29.9)
Rubella Antibodies, IGG: 4.14 index (ref >0.99)

## 2018-02-18 LAB — HIV ANTIBODY (ROUTINE TESTING W REFLEX): HIV Screen 4th Generation wRfx: NONREACTIVE

## 2018-05-25 ENCOUNTER — Ambulatory Visit (INDEPENDENT_AMBULATORY_CARE_PROVIDER_SITE_OTHER): Payer: Managed Care, Other (non HMO) | Admitting: Family Medicine

## 2018-05-25 ENCOUNTER — Encounter: Payer: Self-pay | Admitting: Family Medicine

## 2018-05-25 VITALS — BP 114/76 | HR 76 | Temp 98.6°F | Ht 69.5 in | Wt 205.5 lb

## 2018-05-25 DIAGNOSIS — E7800 Pure hypercholesterolemia, unspecified: Secondary | ICD-10-CM

## 2018-05-25 DIAGNOSIS — T7840XD Allergy, unspecified, subsequent encounter: Secondary | ICD-10-CM

## 2018-05-25 DIAGNOSIS — Z23 Encounter for immunization: Secondary | ICD-10-CM

## 2018-05-25 DIAGNOSIS — Z0000 Encounter for general adult medical examination without abnormal findings: Secondary | ICD-10-CM

## 2018-05-25 LAB — URINALYSIS, ROUTINE W REFLEX MICROSCOPIC
Bilirubin, UA: NEGATIVE
GLUCOSE, UA: NEGATIVE
KETONES UA: NEGATIVE
LEUKOCYTES UA: NEGATIVE
Nitrite, UA: NEGATIVE
Protein, UA: NEGATIVE
RBC, UA: NEGATIVE
Specific Gravity, UA: 1.02 (ref 1.005–1.030)
UUROB: 1 mg/dL (ref 0.2–1.0)
pH, UA: 7 (ref 5.0–7.5)

## 2018-05-25 MED ORDER — FEXOFENADINE-PSEUDOEPHED ER 60-120 MG PO TB12: 1.0000 | ORAL_TABLET | Freq: Every day | ORAL | 6 refills | Status: DC | PRN

## 2018-05-25 MED ORDER — LOVASTATIN 40 MG PO TABS: 40.0000 mg | ORAL_TABLET | Freq: Every day | ORAL | 4 refills | Status: DC

## 2018-05-25 NOTE — Assessment & Plan Note (Signed)
The current medical regimen is effective;  continue present plan and medications.  

## 2018-05-25 NOTE — Progress Notes (Signed)
BP 114/76 (BP Location: Left Arm, Patient Position: Sitting, Cuff Size: Normal)   Pulse 76   Temp 98.6 F (37 C) (Oral)   Ht 5' 9.5" (1.765 m)   Wt 205 lb 8 oz (93.2 kg)   SpO2 97%   BMI 29.91 kg/m    Subjective:    Patient ID: Julian Wolfe, male    DOB: September 01, 1965, 52 y.o.   MRN: [id number]  HPI: Julian Wolfe is a 52 y.o. male  Chief Complaint  Patient presents with  . Annual Exam  Patient all in all doing well no real complaints has some can questions needs a colonoscopy will schedule that for routine 52 year old colonoscopy. Has some concerns about rubella immunity status which is showing nonimmune will need to get vaccine will review. Some nonspecific abdominal symptoms which come and go related to umbilical hernia previously but has resolved largely. Takes Allegra-D 1 a day and lovastatin 1 a day without problems. Has some plantar fasciitis symptoms is doing better shoes and not going barefooted and takes Aleve in the morning.  Relevant past medical, surgical, family and social history reviewed and updated as indicated. Interim medical history since our last visit reviewed. Allergies and medications reviewed and updated.  Review of Systems  Constitutional: Negative.   HENT: Negative.   Eyes: Negative.   Respiratory: Negative.   Cardiovascular: Negative.   Gastrointestinal: Negative.   Endocrine: Negative.   Genitourinary: Negative.   Musculoskeletal: Negative.   Skin: Negative.   Allergic/Immunologic: Negative.   Neurological: Negative.   Hematological: Negative.   Psychiatric/Behavioral: Negative.     Per HPI unless specifically indicated above     Objective:    BP 114/76 (BP Location: Left Arm, Patient Position: Sitting, Cuff Size: Normal)   Pulse 76   Temp 98.6 F (37 C) (Oral)   Ht 5' 9.5" (1.765 m)   Wt 205 lb 8 oz (93.2 kg)   SpO2 97%   BMI 29.91 kg/m   Wt Readings from Last 3 Encounters:  05/25/18 205 lb 8 oz (93.2 kg)  02/17/18 203 lb  (92.1 kg)  11/18/17 202 lb (91.6 kg)    Physical Exam  Constitutional: He is oriented to person, place, and time. He appears well-developed and well-nourished.  HENT:  Head: Normocephalic and atraumatic.  Right Ear: External ear normal.  Left Ear: External ear normal.  Eyes: Pupils are equal, round, and reactive to light. Conjunctivae and EOM are normal.  Neck: Normal range of motion. Neck supple.  Cardiovascular: Normal rate, regular rhythm, normal heart sounds and intact distal pulses.  Pulmonary/Chest: Effort normal and breath sounds normal.  Abdominal: Soft. Bowel sounds are normal. There is no splenomegaly or hepatomegaly.  Genitourinary: Rectum normal, prostate normal and penis normal.  Musculoskeletal: Normal range of motion.  Neurological: He is alert and oriented to person, place, and time. He has normal reflexes.  Skin: No rash noted. No erythema.  Psychiatric: He has a normal mood and affect. His behavior is normal. Judgment and thought content normal.    Results for orders placed or performed in visit on 02/17/18  Comprehensive Metabolic Panel (CMET)  Result Value Ref Range   Glucose 87 65 - 99 mg/dL   BUN 10 6 - 24 mg/dL   Creatinine, Ser 0.75 (L) 0.76 - 1.27 mg/dL   GFR calc non Af Amer 108 >59 mL/min/1.73   GFR calc Af Amer 125 >59 mL/min/1.73   BUN/Creatinine Ratio 13 9 - 20   Sodium 139  134 - 144 mmol/L   Potassium 4.3 3.5 - 5.2 mmol/L   Chloride 104 96 - 106 mmol/L   CO2 23 20 - 29 mmol/L   Calcium 9.2 8.7 - 10.2 mg/dL   Total Protein 6.6 6.0 - 8.5 g/dL   Albumin 4.3 3.5 - 5.5 g/dL   Globulin, Total 2.3 1.5 - 4.5 g/dL   Albumin/Globulin Ratio 1.9 1.2 - 2.2   Bilirubin Total 0.6 0.0 - 1.2 mg/dL   Alkaline Phosphatase 60 39 - 117 IU/L   AST 27 0 - 40 IU/L   ALT 37 0 - 44 IU/L  Lipid Profile  Result Value Ref Range   Cholesterol, Total 182 100 - 199 mg/dL   Triglycerides 79 0 - 149 mg/dL   HDL 44 >39 mg/dL   VLDL Cholesterol Cal 16 5 - 40 mg/dL   LDL  Calculated 122 (H) 0 - 99 mg/dL   Chol/HDL Ratio 4.1 0.0 - 5.0 ratio  HgB A1c  Result Value Ref Range   Hgb A1c MFr Bld 5.6 4.8 - 5.6 %   Est. average glucose Bld gHb Est-mCnc 114 mg/dL  Measles/Mumps/Rubella Immunity  Result Value Ref Range   Rubella Antibodies, IGG 4.14 Immune >0.99 index   RUBEOLA AB, IGG <25.0 (L) Immune >29.9 AU/mL   MUMPS ABS, IGG 76.5 Immune >10.9 AU/mL  HIV antibody (with reflex)  Result Value Ref Range   HIV Screen 4th Generation wRfx Non Reactive Non Reactive      Assessment & Plan:   Problem List Items Addressed This Visit      Other   Hyperlipidemia    The current medical regimen is effective;  continue present plan and medications.       Relevant Medications   lovastatin (MEVACOR) 40 MG tablet   Allergy    The current medical regimen is effective;  continue present plan and medications.       Relevant Medications   fexofenadine-pseudoephedrine (ALLEGRA-D) 60-120 MG 12 hr tablet    Other Visit Diagnoses    Need for influenza vaccination    -  Primary   Relevant Orders   Flu Vaccine QUAD 6+ mos PF IM (Fluarix Quad PF)   PE (physical exam), annual       Relevant Orders   CBC with Differential/Platelet   TSH   PSA   Urinalysis, Routine w reflex microscopic   Ambulatory referral to Gastroenterology    Patient also needs MMR as rubella antibodies are low.  Follow up plan: Return in about 6 months (around 11/23/2018) for BMP,  Lipids, ALT, AST.

## 2018-05-26 ENCOUNTER — Encounter: Payer: Self-pay | Admitting: Family Medicine

## 2018-05-26 ENCOUNTER — Other Ambulatory Visit: Payer: Self-pay

## 2018-05-26 DIAGNOSIS — Z1211 Encounter for screening for malignant neoplasm of colon: Secondary | ICD-10-CM

## 2018-05-26 LAB — CBC WITH DIFFERENTIAL/PLATELET
BASOS ABS: 0.1 10*3/uL (ref 0.0–0.2)
Basos: 1 %
EOS (ABSOLUTE): 0.2 10*3/uL (ref 0.0–0.4)
EOS: 3 %
HEMOGLOBIN: 14.6 g/dL (ref 13.0–17.7)
Hematocrit: 43.2 % (ref 37.5–51.0)
IMMATURE GRANS (ABS): 0 10*3/uL (ref 0.0–0.1)
IMMATURE GRANULOCYTES: 0 %
LYMPHS: 34 %
Lymphocytes Absolute: 1.9 10*3/uL (ref 0.7–3.1)
MCH: 29.3 pg (ref 26.6–33.0)
MCHC: 33.8 g/dL (ref 31.5–35.7)
MCV: 87 fL (ref 79–97)
MONOCYTES: 13 %
Monocytes Absolute: 0.7 10*3/uL (ref 0.1–0.9)
NEUTROS PCT: 49 %
Neutrophils Absolute: 2.7 10*3/uL (ref 1.4–7.0)
Platelets: 232 10*3/uL (ref 150–450)
RBC: 4.98 x10E6/uL (ref 4.14–5.80)
RDW: 12.9 % (ref 12.3–15.4)
WBC: 5.5 10*3/uL (ref 3.4–10.8)

## 2018-05-26 LAB — PSA: PROSTATE SPECIFIC AG, SERUM: 0.6 ng/mL (ref 0.0–4.0)

## 2018-05-26 LAB — TSH: TSH: 2.33 u[IU]/mL (ref 0.450–4.500)

## 2018-05-27 ENCOUNTER — Telehealth: Payer: Self-pay

## 2018-05-27 NOTE — Telephone Encounter (Signed)
Patient notified labs are normal.   Copied from CRM [id number]. Topic: Quick Communication - Lab Results (Clinic Use ONLY) >> May 27, 2018 10:21 AM Crist Infante wrote: Pt calling back for lab results.

## 2018-05-30 IMAGING — CT CT ABD-PELV W/ CM
1 of 3 series · 14 of 32 positions shown, 19 images · IV contrast (iopamidol)
Comparison: 07/15/2008

CLINICAL DATA: Abdominal pain, right lower quadrant pain.

EXAM:
CT ABDOMEN AND PELVIS WITH CONTRAST
TECHNIQUE: Multidetector CT imaging of the abdomen and pelvis was performed
using the standard protocol following bolus administration of
intravenous contrast.
CONTRAST:  100mL UM8ERK-CEE IOPAMIDOL (UM8ERK-CEE) INJECTION 61%

[Series 2: axial st · axial · 0.75mm/px · z∈[-1036,-616]mm · 14 of 96 slices shown, 19 images]
[im 6/96  soft-tissue]
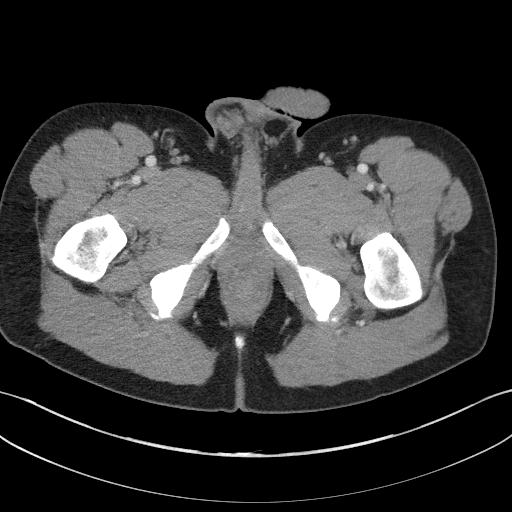
[im 6/96  bone]
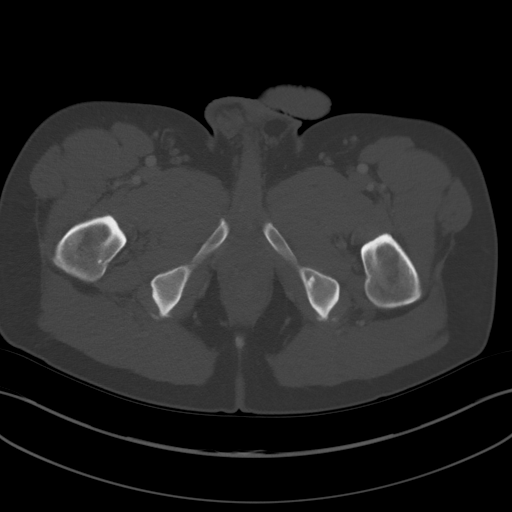
[im 11/96  soft-tissue]
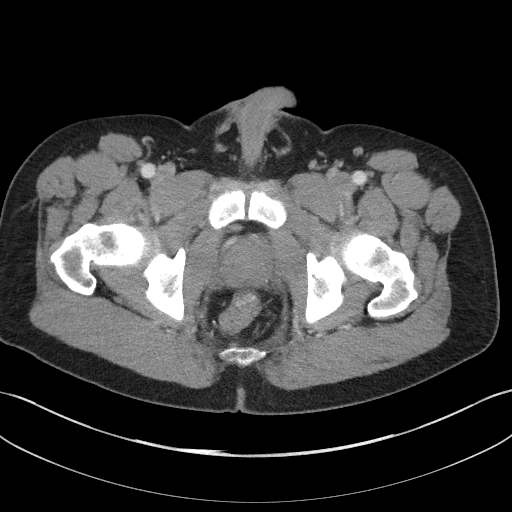
[im 22/96  soft-tissue]
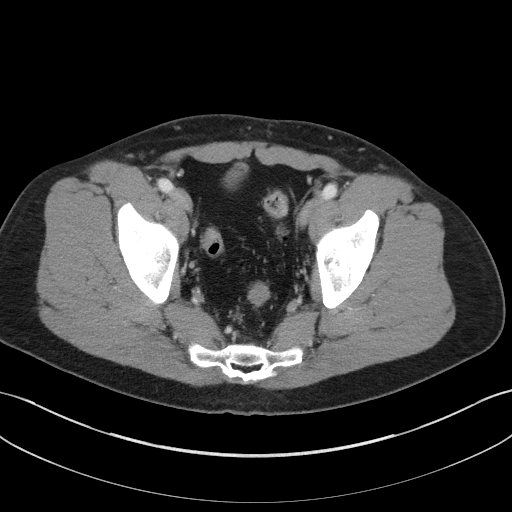
[im 27/96  soft-tissue]
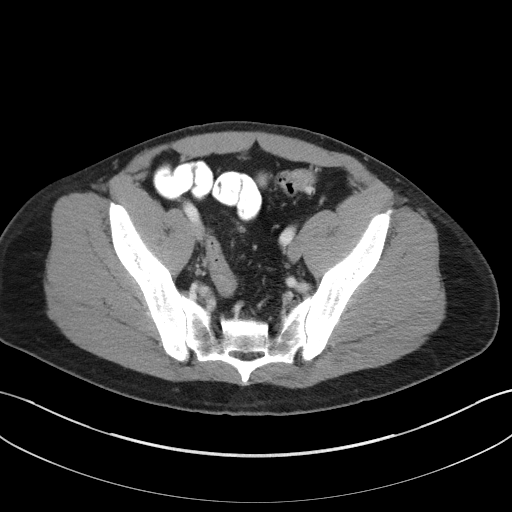
[im 32/96  soft-tissue]
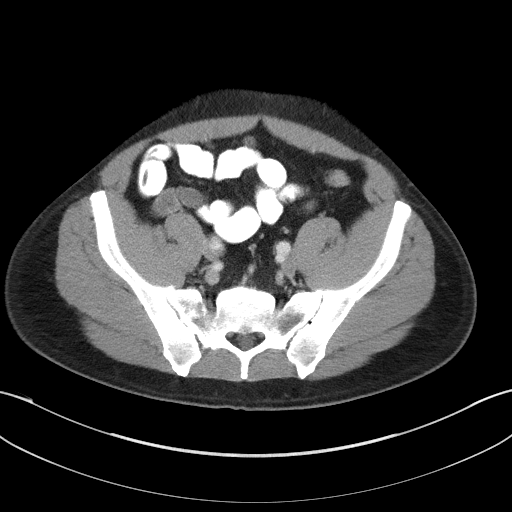
[im 43/96  soft-tissue]
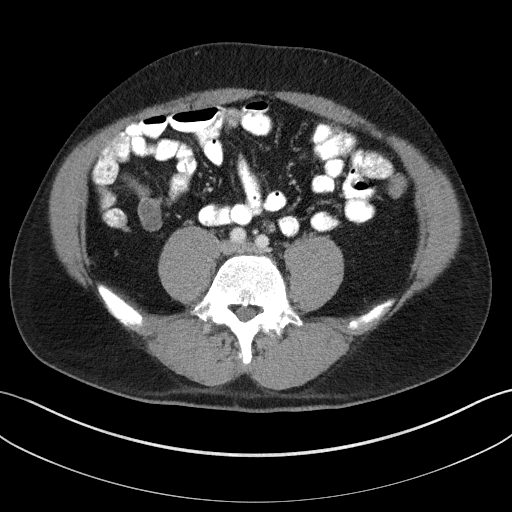
[im 48/96  soft-tissue]
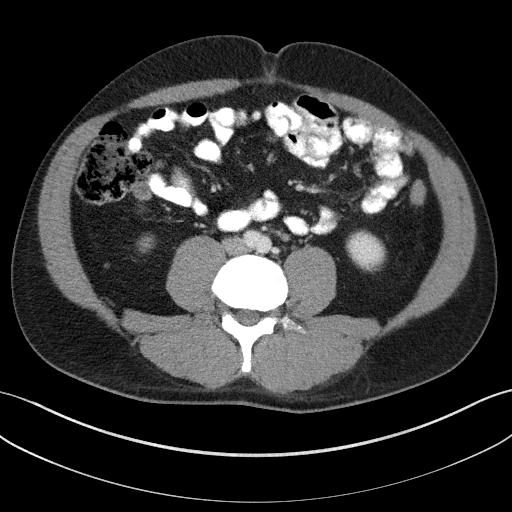
[im 53/96  soft-tissue]
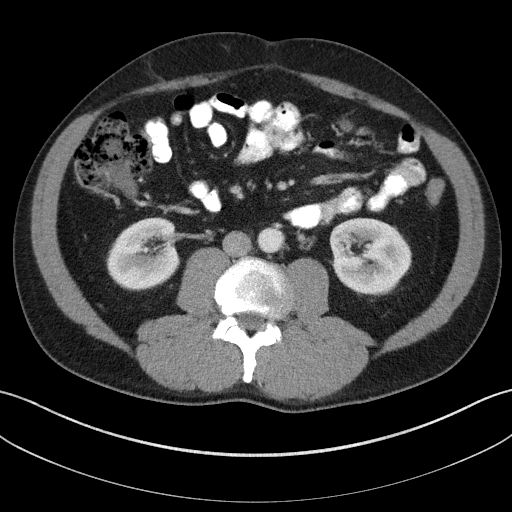
[im 64/96  soft-tissue]
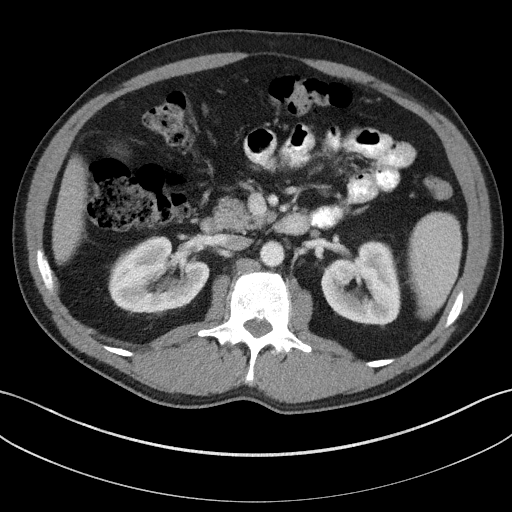
[im 64/96  bone]
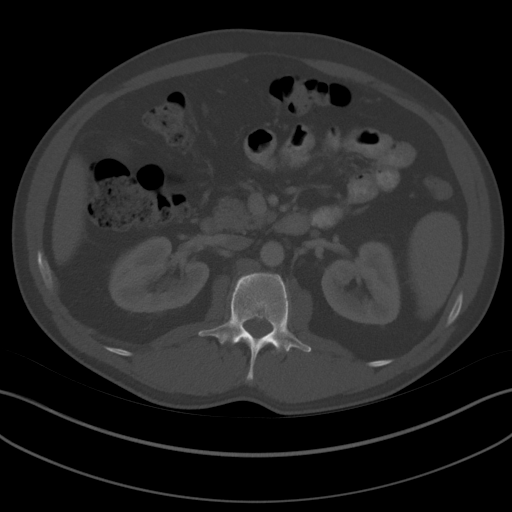
[im 69/96  soft-tissue]
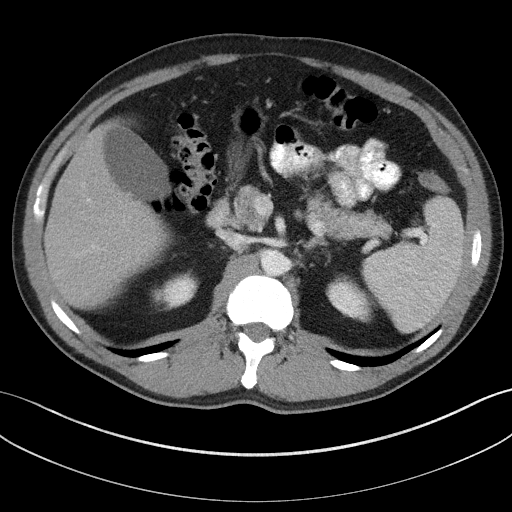
[im 74/96  soft-tissue]
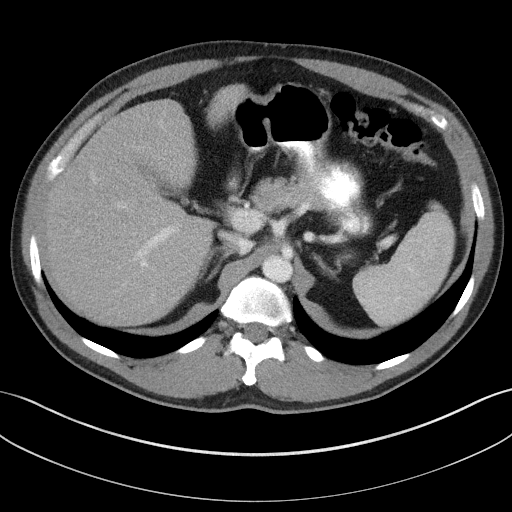
[im 74/96  lung]
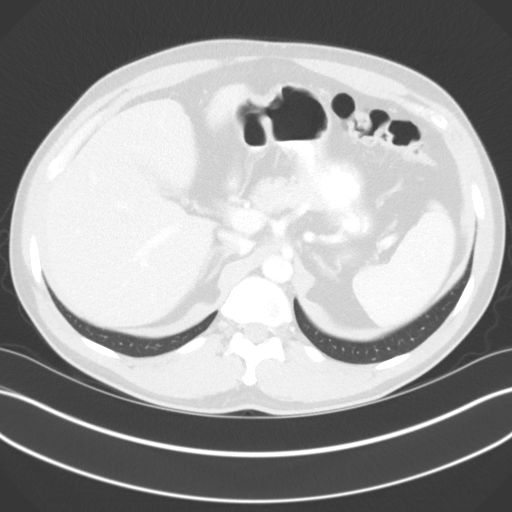
[im 80/96  lung]
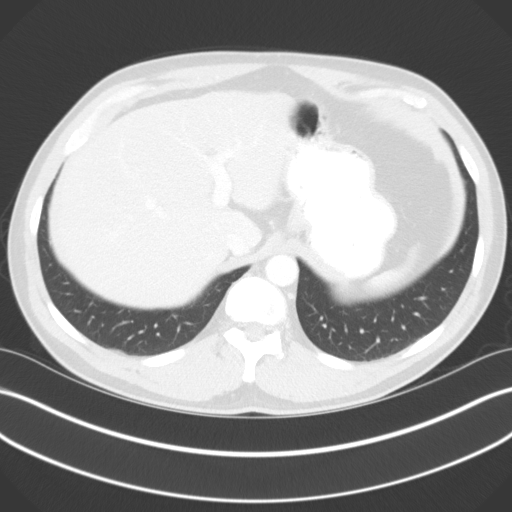
[im 85/96  soft-tissue]
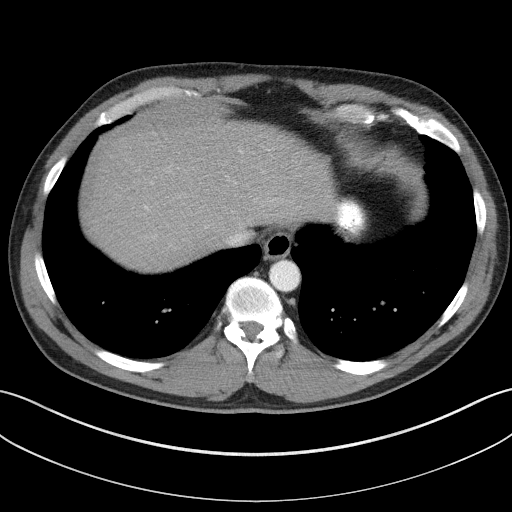
[im 85/96  lung]
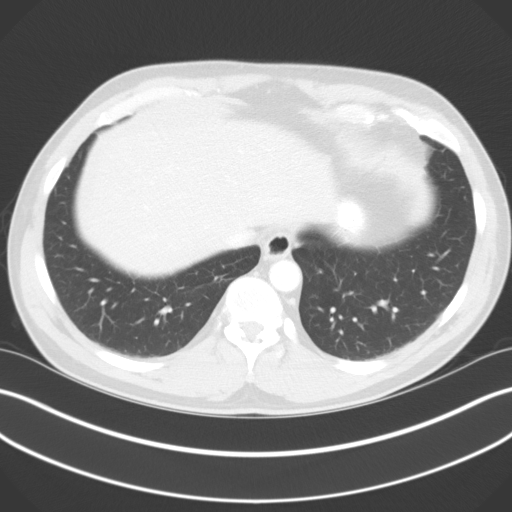
[im 90/96  soft-tissue]
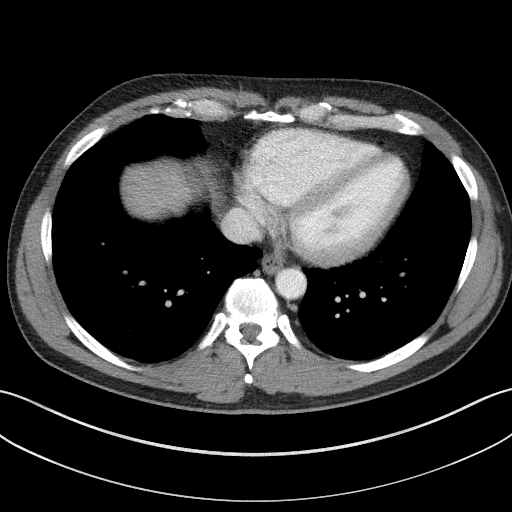
[im 90/96  lung]
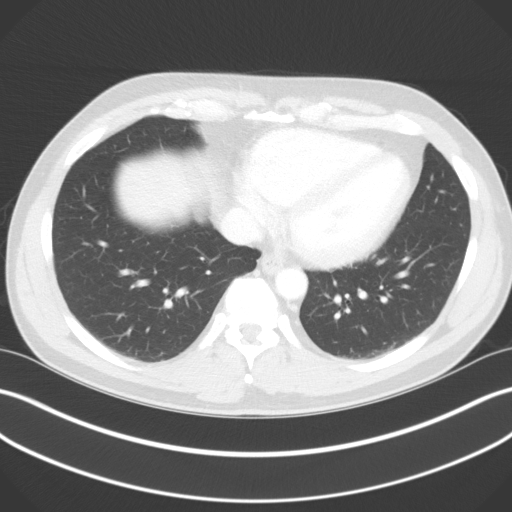

[14 of 32 positions shown; findings below may reference images not displayed]

FINDINGS: Lower chest: Lung bases are clear. No effusions. Heart is normal
size.

Hepatobiliary: No focal hepatic abnormality. Gallbladder
unremarkable.

Pancreas: No focal abnormality or ductal dilatation.

Spleen: No focal abnormality.  Normal size.

Adrenals/Urinary Tract: No adrenal abnormality. No focal renal
abnormality. No stones or hydronephrosis. Urinary bladder is
unremarkable.

Stomach/Bowel: Few scattered sigmoid diverticula. No active
diverticulitis. Appendix is normal. Stomach and small bowel
decompressed, unremarkable.

Vascular/Lymphatic: No evidence of aneurysm or adenopathy.

Reproductive: No visible focal abnormality

Other: No free fluid or free air.

Musculoskeletal: No acute bony abnormality or focal bone lesion.
IMPRESSION: Normal appendix.

No acute findings in the abdomen or pelvis.

## 2018-06-07 ENCOUNTER — Encounter: Payer: Self-pay | Admitting: Gastroenterology

## 2018-06-07 ENCOUNTER — Ambulatory Visit (INDEPENDENT_AMBULATORY_CARE_PROVIDER_SITE_OTHER): Payer: Managed Care, Other (non HMO) | Admitting: Gastroenterology

## 2018-06-07 VITALS — BP 128/87 | HR 73 | Ht 69.0 in | Wt 207.2 lb

## 2018-06-07 DIAGNOSIS — R194 Change in bowel habit: Secondary | ICD-10-CM

## 2018-06-07 NOTE — Progress Notes (Signed)
Julian BouillonVarnita Trinaty Wolfe [address]  [address]  [place], [place] [address]  Main: [phone]  Fax: [phone]   Gastroenterology Consultation  Referring Provider:     Steele Sizerrissman, Mark A, MD Primary Care Physician:  Steele Sizerrissman, Mark A, MD Primary Gastroenterologist:  Dr. Melodie BouillonVarnita Julian Wolfe Reason for Consultation:     GI symptoms, screening colonoscopy        HPI:    Chief Complaint  Patient presents with  . New Patient (Initial Visit)    referred by Dr. Dossie Arbourrissman for screening colonoscopy    Julian Wolfe is a 52 y.o. y/o male referred for consultation & management  by Dr. Dossie Arbourrissman, Redge GainerMark A, MD.  Patient is scheduled for his first screening colonoscopy and wanted to discuss GI symptoms in clinic before the procedure.  Reports chronic history for years of varying bowel habits.  Reports anywhere from 2-7 bowel movements a day daily.  States they all occur in the morning.  On the days that he has 7 bowel movements he will have the first 2 bowel movements to be larger than the subsequent ones which are much smaller.  Reports intermittent bright red blood only in the toilet paper not in the stool.  No nausea or vomiting.  No abdominal pain.  Reports good appetite.  However, reports increased bloating with certain foods such as nuts, spaghetti and spicy foods.  No weight loss.  No prior EGDs.  No family history of colon cancer.  Reports trying a lactose-free diet previously due to intermittent bloating and this did not help.  Labs otherwise reassuring with normal hemoglobin, liver enzymes and electrolytes.  CT abdomen pelvis in June 2017 showed normal appendix and no acute findings.  Past Medical History:  Diagnosis Date  . Allergy   . Anxiety   . Depression   . Hemorrhoid   . Hyperlipidemia     No past surgical history on file.  Prior to Admission medications   Medication Sig Start Date End Date Taking? Authorizing Provider  fexofenadine-pseudoephedrine (ALLEGRA-D) 60-120 MG 12  hr tablet Take 1 tablet by mouth daily as needed. 05/25/18  Yes Crissman, Redge GainerMark A, MD  lovastatin (MEVACOR) 40 MG tablet Take 1 tablet (40 mg total) by mouth daily. 05/25/18  Yes Crissman, Redge GainerMark A, MD  naproxen sodium (ALEVE) 220 MG tablet Take 220 mg by mouth.   Yes [provider]    Family History  Problem Relation Age of Onset  . Hypertension Mother   . Dementia Mother   . Parkinson's disease Mother   . Cancer Father [phone]       prostate  . Parkinson's disease Father      Social History   Tobacco Use  . Smoking status: Never Smoker  . Smokeless tobacco: Former NeurosurgeonUser    Types: Chew  Substance Use Topics  . Alcohol use: Yes    Comment: rare  . Drug use: No    Allergies as of 06/07/2018 - Review Complete 06/07/2018  Allergen Reaction Noted  . Sulfur Nausea And Vomiting 11/27/2016    Review of Systems:    All systems reviewed and negative except where noted in HPI.   Physical Exam:  BP 128/87   Pulse 73   Ht 5\' 9"  (1.753 m)   Wt 207 lb 3.2 oz (94 kg)   BMI 30.60 kg/m  No LMP for male patient. Psych:  Alert and cooperative. Normal mood and affect. General:   Alert,  Well-developed, well-nourished, pleasant and cooperative in  NAD Head:  Normocephalic and atraumatic. Eyes:  Sclera clear, no icterus.   Conjunctiva pink. Ears:  Normal auditory acuity. Nose:  No deformity, discharge, or lesions. Mouth:  No deformity or lesions,oropharynx pink & moist. Neck:  Supple; no masses or thyromegaly. Abdomen:  Normal bowel sounds.  No bruits.  Soft, non-tender and non-distended without masses, hepatosplenomegaly or hernias noted.  No guarding or rebound tenderness.    Msk:  Symmetrical without gross deformities. Good, equal movement & strength bilaterally. Pulses:  Normal pulses noted. Extremities:  No clubbing or edema.  No cyanosis. Neurologic:  Alert and oriented x3;  grossly normal neurologically. Skin:  Intact without significant lesions or rashes. No jaundice. Lymph  Nodes:  No significant cervical adenopathy. Psych:  Alert and cooperative. Normal mood and affect.   Labs: CBC    Component Value Date/Time   WBC 5.5 05/25/2018 0946   RBC 4.98 05/25/2018 0946   HGB 14.6 05/25/2018 0946   HCT 43.2 05/25/2018 0946   PLT 232 05/25/2018 0946   MCV 87 05/25/2018 0946   MCH 29.3 05/25/2018 0946   MCHC 33.8 05/25/2018 0946   RDW 12.9 05/25/2018 0946   LYMPHSABS 1.9 05/25/2018 0946   EOSABS 0.2 05/25/2018 0946   BASOSABS 0.1 05/25/2018 0946   CMP     Component Value Date/Time   NA 139 02/17/2018 0936   K 4.3 02/17/2018 0936   CL 104 02/17/2018 0936   CO2 23 02/17/2018 0936   GLUCOSE 87 02/17/2018 0936   BUN 10 02/17/2018 0936   CREATININE 0.75 (L) 02/17/2018 0936   CALCIUM 9.2 02/17/2018 0936   PROT 6.6 02/17/2018 0936   ALBUMIN 4.3 02/17/2018 0936   AST 27 02/17/2018 0936   AST 29 11/18/2017 0854   ALT 37 02/17/2018 0936   ALT 39 11/18/2017 0854   ALKPHOS 60 02/17/2018 0936   BILITOT 0.6 02/17/2018 0936   GFRNONAA 108 02/17/2018 0936   GFRAA 125 02/17/2018 0936    Imaging Studies: No results found.  Assessment and Plan:   RAD GRAMLING is a 52 y.o. y/o male has been referred for screening colonoscopy, and discussion of intermittent GI symptoms  Patient is agreeable to proceeding with screening colonoscopy It would also allow [place] to evaluate for any underlying inflammation given anywhere from 2-7 bowel movements a day.  However, he states these are formed and have been chronic.  With chronic symptoms and normal hemoglobin, unlikely that he has underlying inflammation. We will obtain biopsies for microscopic colitis even if endoscopy is normal  Given intermittent bloating we discussed FODMAP diet and have given him a handout for the same We also discussed EGD to obtain biopsies for H. pylori and he is agreeable to proceeding with EGD along with his colonoscopy as well  I have discussed alternative options, risks & benefits,  which  include, but are not limited to, bleeding, infection, perforation,respiratory complication & drug reaction.  The patient agrees with this plan & written consent will be obtained.       Dr Julian Wolfe  Speech recognition software was used to dictate the above note.

## 2018-06-08 ENCOUNTER — Telehealth: Payer: Self-pay

## 2018-06-08 NOTE — Telephone Encounter (Signed)
Notified Trish at [facility] to add EGD to Colonoscopy scheduled for 07/06/2018 for microscopic colitis.

## 2018-07-04 ENCOUNTER — Telehealth: Payer: Self-pay | Admitting: Gastroenterology

## 2018-07-04 NOTE — Telephone Encounter (Signed)
Left message that I would cancel his EGD and keep colonoscopy procedure. I got this clarified by Dr. Maximino Greenlandahiliani. Pt thought they were both screenings. Per Dr. Maximino Greenlandahiliani: Colonoscopy: screening and EGD: abdominal pain. I would like to speak with pt since this information about checking for H Pylori via EGD, could he be tested for this another way. If it is not for screening, his insurance will not cover it.

## 2018-07-04 NOTE — Telephone Encounter (Signed)
Pt is calling he has procedure 07/06/2018  And he received a call from hospital about facility fee he states they are coding it not as screening due to gastritis he would like to clarify before procedure please call pot

## 2018-07-05 NOTE — Telephone Encounter (Signed)
Pt states that the EGD would be over $4000 and is waiting on insurance company to let him know if all of this he would have to pay or just a portion of it. He is suppose to hear from them today and contact office to let me know if he is having procedure or not. Pt states his side pain is just sometimes, pulling and gassy. Bowel movements 2-7 a day. He states this has not bothered him in a month. He wants to do what is necessary but the cost is over whelming.

## 2018-07-06 ENCOUNTER — Ambulatory Visit: Payer: Managed Care, Other (non HMO) | Admitting: Anesthesiology

## 2018-07-06 ENCOUNTER — Ambulatory Visit
Admission: RE | Admit: 2018-07-06 | Discharge: 2018-07-06 | Disposition: A | Discharge status: Home / Self Care | Payer: Managed Care, Other (non HMO) | Attending: Gastroenterology | Admitting: Gastroenterology

## 2018-07-06 ENCOUNTER — Encounter
Admission: RE | Disposition: A | Discharge status: Home / Self Care | Payer: Self-pay | Source: Home / Self Care | Attending: Gastroenterology

## 2018-07-06 DIAGNOSIS — E785 Hyperlipidemia, unspecified: Secondary | ICD-10-CM | POA: Insufficient documentation

## 2018-07-06 DIAGNOSIS — K317 Polyp of stomach and duodenum: Secondary | ICD-10-CM

## 2018-07-06 DIAGNOSIS — R1013 Epigastric pain: Secondary | ICD-10-CM | POA: Diagnosis not present

## 2018-07-06 DIAGNOSIS — R140 Abdominal distension (gaseous): Secondary | ICD-10-CM | POA: Diagnosis not present

## 2018-07-06 DIAGNOSIS — F329 Major depressive disorder, single episode, unspecified: Secondary | ICD-10-CM | POA: Insufficient documentation

## 2018-07-06 DIAGNOSIS — Z1211 Encounter for screening for malignant neoplasm of colon: Secondary | ICD-10-CM

## 2018-07-06 DIAGNOSIS — F419 Anxiety disorder, unspecified: Secondary | ICD-10-CM | POA: Diagnosis not present

## 2018-07-06 DIAGNOSIS — K648 Other hemorrhoids: Secondary | ICD-10-CM | POA: Insufficient documentation

## 2018-07-06 DIAGNOSIS — K228 Other specified diseases of esophagus: Secondary | ICD-10-CM | POA: Insufficient documentation

## 2018-07-06 DIAGNOSIS — K644 Residual hemorrhoidal skin tags: Secondary | ICD-10-CM | POA: Diagnosis not present

## 2018-07-06 DIAGNOSIS — Z79899 Other long term (current) drug therapy: Secondary | ICD-10-CM | POA: Diagnosis not present

## 2018-07-06 DIAGNOSIS — G4730 Sleep apnea, unspecified: Secondary | ICD-10-CM | POA: Insufficient documentation

## 2018-07-06 DIAGNOSIS — K5730 Diverticulosis of large intestine without perforation or abscess without bleeding: Secondary | ICD-10-CM | POA: Insufficient documentation

## 2018-07-06 HISTORY — DX: Sleep apnea, unspecified: G47.30

## 2018-07-06 HISTORY — PX: ESOPHAGOGASTRODUODENOSCOPY (EGD) WITH PROPOFOL: SHX5813

## 2018-07-06 HISTORY — PX: COLONOSCOPY WITH PROPOFOL: SHX5780

## 2018-07-06 LAB — KOH PREP: KOH Prep: NONE SEEN

## 2018-07-06 SURGERY — COLONOSCOPY WITH PROPOFOL
Anesthesia: General

## 2018-07-06 MED ORDER — GLYCOPYRROLATE 0.2 MG/ML IJ SOLN
INTRAMUSCULAR | Status: AC
  Filled 2018-07-06: qty 1

## 2018-07-06 MED ORDER — MIDAZOLAM HCL 2 MG/2ML IJ SOLN
INTRAMUSCULAR | Status: AC
  Filled 2018-07-06: qty 2

## 2018-07-06 MED ORDER — PROPOFOL 500 MG/50ML IV EMUL
INTRAVENOUS | Status: DC | PRN
  Administered 2018-07-06: 140 ug/kg/min via INTRAVENOUS

## 2018-07-06 MED ORDER — FENTANYL CITRATE (PF) 100 MCG/2ML IJ SOLN
INTRAMUSCULAR | Status: AC
  Filled 2018-07-06: qty 2

## 2018-07-06 MED ORDER — MIDAZOLAM HCL 2 MG/2ML IJ SOLN
INTRAMUSCULAR | Status: DC | PRN
  Administered 2018-07-06: 2 mg via INTRAVENOUS

## 2018-07-06 MED ORDER — LIDOCAINE HCL (CARDIAC) PF 100 MG/5ML IV SOSY
PREFILLED_SYRINGE | INTRAVENOUS | Status: DC | PRN
  Administered 2018-07-06: 60 mg via INTRAVENOUS

## 2018-07-06 MED ORDER — SODIUM CHLORIDE 0.9 % IV SOLN
INTRAVENOUS | Status: DC
  Administered 2018-07-06: 10:00:00 via INTRAVENOUS

## 2018-07-06 MED ORDER — FENTANYL CITRATE (PF) 100 MCG/2ML IJ SOLN
INTRAMUSCULAR | Status: DC | PRN
  Administered 2018-07-06 (×2): 25 ug via INTRAVENOUS

## 2018-07-06 MED ORDER — GLYCOPYRROLATE 0.2 MG/ML IJ SOLN
INTRAMUSCULAR | Status: DC | PRN
  Administered 2018-07-06: 0.2 mg via INTRAVENOUS

## 2018-07-06 MED ORDER — PROPOFOL 10 MG/ML IV BOLUS
INTRAVENOUS | Status: DC | PRN
  Administered 2018-07-06: 70 mg via INTRAVENOUS

## 2018-07-06 NOTE — Anesthesia Preprocedure Evaluation (Signed)
Anesthesia Evaluation  Patient identified by MRN, date of birth, ID band Patient awake    Reviewed: Allergy & Precautions, NPO status , Patient's Chart, lab work & pertinent test results  History of Anesthesia Complications Negative for: history of anesthetic complications  Airway Mallampati: III  TM Distance: >3 FB Neck ROM: Full  Mouth opening: Limited Mouth Opening  Dental no notable dental hx.    Pulmonary sleep apnea , neg COPD,    breath sounds clear to auscultation- rhonchi (-) wheezing      Cardiovascular Exercise Tolerance: Good (-) hypertension(-) CAD, (-) Past MI, (-) Cardiac Stents and (-) CABG  Rhythm:Regular Rate:Normal - Systolic murmurs and - Diastolic murmurs    Neuro/Psych neg Seizures PSYCHIATRIC DISORDERS Anxiety Depression negative neurological ROS     GI/Hepatic negative GI ROS, Neg liver ROS,   Endo/Other  negative endocrine ROSneg diabetes  Renal/GU negative Renal ROS     Musculoskeletal negative musculoskeletal ROS (+)   Abdominal (+) - obese,   Peds  Hematology negative hematology ROS (+)   Anesthesia Other Findings Past Medical History: No date: Allergy No date: Anxiety No date: Depression No date: Hemorrhoid No date: Hyperlipidemia No date: Sleep apnea   Reproductive/Obstetrics                             Anesthesia Physical Anesthesia Plan  ASA: II  Anesthesia Plan: General   Post-op Pain Management:    Induction: Intravenous  PONV Risk Score and Plan: 1 and Propofol infusion  Airway Management Planned: Natural Airway  Additional Equipment:   Intra-op Plan:   Post-operative Plan:   Informed Consent: I have reviewed the patients History and Physical, chart, labs and discussed the procedure including the risks, benefits and alternatives for the proposed anesthesia with the patient or authorized representative who has indicated his/her  understanding and acceptance.   Dental advisory given  Plan Discussed with: CRNA and Anesthesiologist  Anesthesia Plan Comments:         Anesthesia Quick Evaluation

## 2018-07-06 NOTE — Anesthesia Post-op Follow-up Note (Signed)
Anesthesia QCDR form completed.        

## 2018-07-06 NOTE — Anesthesia Postprocedure Evaluation (Signed)
Anesthesia Post Note  Patient: Julian Wolfe  Procedure(s) Performed: COLONOSCOPY WITH PROPOFOL (N/A ) ESOPHAGOGASTRODUODENOSCOPY (EGD) WITH PROPOFOL  Patient location during evaluation: Endoscopy Anesthesia Type: General Level of consciousness: awake and alert and oriented Pain management: pain level controlled Vital Signs Assessment: post-procedure vital signs reviewed and stable Respiratory status: spontaneous breathing, nonlabored ventilation and respiratory function stable Cardiovascular status: blood pressure returned to baseline and stable Postop Assessment: no signs of nausea or vomiting Anesthetic complications: no     Last Vitals:  Vitals:   07/06/18 1134 07/06/18 1138  BP: 119/73 119/73  Pulse: 92 92  Resp:  15  Temp: (!) 36.1 C (!) 36.1 C  SpO2: 100% 97%    Last Pain:  Vitals:   07/06/18 1134  TempSrc: Tympanic  PainSc:                  [name]

## 2018-07-06 NOTE — Anesthesia Procedure Notes (Signed)
Performed by: Shaindel Sweeten, CRNA Pre-anesthesia Checklist: Patient identified, Emergency Drugs available, Suction available and Patient being monitored Patient Re-evaluated:Patient Re-evaluated prior to induction Oxygen Delivery Method: Nasal cannula Induction Type: IV induction       

## 2018-07-06 NOTE — H&P (Signed)
Melodie Bouillon, MD [address], [place], [place], [address] [address], [address], [place], [place], [address] Phone: [phone]  Fax: [phone]  Primary Care Physician:  Steele Sizer, MD   Pre-Procedure History & Physical: HPI:  Julian Wolfe is a 52 y.o. male is here for a colonoscopy and EGD.   Past Medical History:  Diagnosis Date  . Allergy   . Anxiety   . Depression   . Hemorrhoid   . Hyperlipidemia   . Sleep apnea     History reviewed. No pertinent surgical history.  Prior to Admission medications   Medication Sig Start Date End Date Taking? Authorizing Provider  fexofenadine-pseudoephedrine (ALLEGRA-D) 60-120 MG 12 hr tablet Take 1 tablet by mouth daily as needed. 05/25/18  Yes Crissman, Redge Gainer, MD  lovastatin (MEVACOR) 40 MG tablet Take 1 tablet (40 mg total) by mouth daily. 05/25/18  Yes Crissman, Redge Gainer, MD  naproxen sodium (ALEVE) 220 MG tablet Take 220 mg by mouth.    [provider]    Allergies as of 05/26/2018 - Review Complete 05/25/2018  Allergen Reaction Noted  . Sulfur Nausea And Vomiting 11/27/2016    Family History  Problem Relation Age of Onset  . Hypertension Mother   . Dementia Mother   . Parkinson's disease Mother   . Cancer Father 54       prostate  . Parkinson's disease Father     Social History   Socioeconomic History  . Marital status: Married    Spouse name: Not on file  . Number of children: Not on file  . Years of education: Not on file  . Highest education level: Not on file  Occupational History  . Not on file  Social Needs  . Financial resource strain: Not on file  . Food insecurity:    Worry: Not on file    Inability: Not on file  . Transportation needs:    Medical: Not on file    Non-medical: Not on file  Tobacco Use  . Smoking status: Never Smoker  . Smokeless tobacco: Former Neurosurgeon    Types: Chew  Substance and Sexual Activity  . Alcohol use: Yes    Comment: rare  . Drug  use: No  . Sexual activity: Not on file  Lifestyle  . Physical activity:    Days per week: Not on file    Minutes per session: Not on file  . Stress: Not on file  Relationships  . Social connections:    Talks on phone: Not on file    Gets together: Not on file    Attends religious service: Not on file    Active member of club or organization: Not on file    Attends meetings of clubs or organizations: Not on file    Relationship status: Not on file  . Intimate partner violence:    Fear of current or ex partner: Not on file    Emotionally abused: Not on file    Physically abused: Not on file    Forced sexual activity: Not on file  Other Topics Concern  . Not on file  Social History Narrative  . Not on file    Review of Systems: See HPI, otherwise negative ROS  Physical Exam: BP (!) 135/92   Pulse 79   Temp (!) 96.2 F (35.7 C) (Tympanic)   Resp 20   Ht 5\' 9"  (1.753 m)   Wt 93 kg   SpO2 100%  BMI 30.27 kg/m  General:   Alert,  pleasant and cooperative in NAD Head:  Normocephalic and atraumatic. Neck:  Supple; no masses or thyromegaly. Lungs:  Clear throughout to auscultation, normal respiratory effort.    Heart:  +S1, +S2, Regular rate and rhythm, No edema. Abdomen:  Soft, nontender and nondistended. Normal bowel sounds, without guarding, and without rebound.   Neurologic:  Alert and  oriented x4;  grossly normal neurologically.  Impression/Plan: Julian Wolfe is here for a colonoscopy to be performed for average risk screening and EGD for abdominal bloating and pain. I extensively talked to patient and family member about alternatives of EGD including H Pylori breath test, and FODMAP diet and then proceeding with EGD if symptoms do not resolve. Pt would like to proceed with EGD today and understands that he may get a bill from his insurance for it and we do not know how much that would be.   Risks, benefits, limitations, and alternatives regarding the procedures  have been reviewed with the patient.  Questions have been answered.  All parties agreeable.   Pasty SpillersVarnita B Shyquan Stallbaumer, MD  07/06/2018, 10:40 AM

## 2018-07-06 NOTE — Transfer of Care (Signed)
Immediate Anesthesia Transfer of Care Note  Patient: Julian Wolfe  Procedure(s) Performed: COLONOSCOPY WITH PROPOFOL (N/A ) ESOPHAGOGASTRODUODENOSCOPY (EGD) WITH PROPOFOL  Patient Location: PACU  Anesthesia Type:General  Level of Consciousness: sedated  Airway & Oxygen Therapy: Patient Spontanous Breathing and Patient connected to nasal cannula oxygen  Post-op Assessment: Report given to RN and Post -op Vital signs reviewed and stable  Post vital signs: Reviewed and stable  Last Vitals:  Vitals Value Taken Time  BP 119/73 07/06/2018 11:38 AM  Temp 36.1 C 07/06/2018 11:38 AM  Pulse 86 07/06/2018 11:39 AM  Resp 14 07/06/2018 11:39 AM  SpO2 99 % 07/06/2018 11:39 AM  Vitals shown include unvalidated device data.  Last Pain:  Vitals:   07/06/18 1134  TempSrc: Tympanic  PainSc:          Complications: No apparent anesthesia complications

## 2018-07-06 NOTE — Op Note (Signed)
[facility] Gastroenterology Patient Name: Julian Wolfe Procedure Date: 07/06/2018 10:42 AM MRN: [id number] Account #: [id number] Date of Birth: 11-29-1965 Admit Type: Outpatient Age: [age] Room: endo 1 Gender: Male Note Status: Finalized Procedure:            Upper GI endoscopy Indications:          Epigastric abdominal pain, Abdominal bloating Providers:            Cleto Claggett B. Maximino Greenlandahiliani MD, MD Referring MD:         Steele SizerMark A. Crissman, MD (Referring MD) Medicines:            Monitored Anesthesia Care Complications:        No immediate complications. Procedure:            Pre-Anesthesia Assessment:                       - Prior to the procedure, a History and Physical was                        performed, and patient medications, allergies and                        sensitivities were reviewed. The patient's tolerance of                        previous anesthesia was reviewed.                       - The risks and benefits of the procedure and the                        sedation options and risks were discussed with the                        patient. All questions were answered and informed                        consent was obtained.                       - Patient identification and proposed procedure were                        verified prior to the procedure by the physician, the                        nurse, the anesthesiologist, the anesthetist and the                        technician. The procedure was verified in the procedure                        room.                       - ASA Grade Assessment: II - A patient with mild                        systemic disease.  After obtaining informed consent, the endoscope was                        passed under direct vision. Throughout the procedure,                        the patient's blood pressure, pulse, and oxygen                        saturations were monitored continuously. The  Endoscope                        was introduced through the mouth, and advanced to the                        second part of duodenum. The upper GI endoscopy was                        accomplished with ease. The patient tolerated the                        procedure well. Findings:      White nummular lesions were noted in the distal esophagus. Brushings for       KOH prep were obtained.      The exam of the esophagus was otherwise normal.      The entire examined stomach was normal. Biopsies were obtained in the       gastric body, at the incisura and in the gastric antrum with cold       forceps for histology. Biopsies were taken with a cold forceps for       Helicobacter pylori testing.      A single 4 mm sessile polyp with no bleeding and no stigmata of recent       bleeding was found in the gastric body. Biopsies were taken with a cold       forceps for histology.      The duodenal bulb, second portion of the duodenum and examined duodenum       were normal. Impression:           - White nummular lesions in esophageal mucosa.                        Brushings performed.                       - Normal stomach. Biopsied.                       - A single gastric polyp. Biopsied.                       - Normal duodenal bulb, second portion of the duodenum                        and examined duodenum.                       - Biopsies were obtained in the gastric body, at the                        incisura and in the  gastric antrum. Recommendation:       - Discharge patient to home (with escort).                       - Advance diet as tolerated.                       - Continue present medications.                       - Patient has a contact number available for                        emergencies. The signs and symptoms of potential                        delayed complications were discussed with the patient.                        Return to normal activities tomorrow. Written  discharge                        instructions were provided to the patient.                       - Discharge patient to home (with escort).                       - The findings and recommendations were discussed with                        the patient.                       - The findings and recommendations were discussed with                        the patient's family. Procedure Code(s):    --- Professional ---                       [phone], Esophagogastroduodenoscopy, flexible, transoral;                        with biopsy, single or multiple Diagnosis Code(s):    --- Professional ---                       K22.8, Other specified diseases of esophagus                       K31.7, Polyp of stomach and duodenum                       R10.13, Epigastric pain                       R14.0, Abdominal distension (gaseous) CPT copyright 2018 American Medical Association. All rights reserved. The codes documented in this report are preliminary and upon coder review may  be revised to meet current compliance requirements.  Melodie Bouillon, MD Michel Bickers B. Maximino Greenland MD, MD 07/06/2018 11:05:11 AM This report has been signed electronically. Number of Addenda: 0 Note Initiated On: 07/06/2018 10:42 AM Estimated Blood Loss: Estimated blood loss:  none. Estimated blood loss: none.      [facility]

## 2018-07-06 NOTE — Op Note (Signed)
[facility] Patient Name: Julian Wolfe Procedure Date: 07/06/2018 10:30 AM MRN: [id number] Account #: [id number] Date of Birth: 09-30-1965 Admit Type: Outpatient Age: 52 Room: [facility] ENDO ROOM 1 Gender: Male Note Status: Finalized Procedure:            Colonoscopy Indications:          Screening for colorectal malignant neoplasm Providers:            Pina Sirianni B. Maximino Greenland MD, MD Referring MD:         Steele Sizer, MD (Referring MD) Medicines:            Monitored Anesthesia Care Complications:        No immediate complications. Procedure:            Pre-Anesthesia Assessment:                       - Prior to the procedure, a History and Physical was                        performed, and patient medications, allergies and                        sensitivities were reviewed. The patient's tolerance of                        previous anesthesia was reviewed.                       - The risks and benefits of the procedure and the                        sedation options and risks were discussed with the                        patient. All questions were answered and informed                        consent was obtained.                       - Patient identification and proposed procedure were                        verified prior to the procedure by the physician, the                        nurse, the anesthetist and the technician. The                        procedure was verified in the pre-procedure area in the                        procedure room in the endoscopy suite.                       - ASA Grade Assessment: II - A patient with mild                        systemic disease.                       -  After reviewing the risks and benefits, the patient                        was deemed in satisfactory condition to undergo the                        procedure.                       After obtaining informed consent, the colonoscope was                  passed under direct vision. Throughout the procedure,                        the patient's blood pressure, pulse, and oxygen                        saturations were monitored continuously. The                        Colonoscope was introduced through the anus and                        advanced to the the terminal ileum. The colonoscopy was                        performed with ease. The patient tolerated the                        procedure well. The quality of the bowel preparation                        was good except the ascending colon was fair and the                        cecum was fair. Findings:      The perianal exam findings include non-thrombosed external hemorrhoids.      Multiple diverticula were found in the sigmoid colon.      The exam was otherwise without abnormality.      The rectum, sigmoid colon, descending colon, transverse colon, ascending       colon, cecum and ileum appeared normal. Biopsies for histology were       taken with a cold forceps from the right colon, left colon and rectum       for evaluation of microscopic colitis.      Non-bleeding internal hemorrhoids were found during retroflexion. The       hemorrhoids were small. Impression:           - Non-thrombosed external hemorrhoids found on perianal                        exam.                       - Diverticulosis in the sigmoid colon.                       - The examination was otherwise normal.                       - The rectum, sigmoid  colon, descending colon,                        transverse colon, ascending colon and cecum are normal.                        Biopsied.                       - Non-bleeding internal hemorrhoids. Recommendation:       - The external hemorrhoid was larger than the internal                        hemorrhoid. Continue high fiber diet. If pt has bright                        red blood per rectum, refer to surgery for external                         hemorrhoidectomy.                       - Await pathology results.                       - Discharge patient to home.                       - Resume previous diet.                       - Continue present medications.                       - Repeat colonoscopy in 3 years for screening purposes.                        (The colon was cleaned with water and suctioning due to                        the fair prep. 3 years repeat recommended due to fair                        prep on this exam, which was claned well for better                        visualization).                       - Return to primary care physician as previously                        scheduled.                       - The findings and recommendations were discussed with                        the patient.                       - The findings and recommendations were discussed with  the patient's family.                       - High fiber diet. Procedure Code(s):    --- Professional ---                       [id number], Colonoscopy, flexible; with biopsy, single or                        multiple Diagnosis Code(s):    --- Professional ---                       Z12.11, Encounter for screening for malignant neoplasm                        of colon                       K64.4, Residual hemorrhoidal skin tags                       K64.8, Other hemorrhoids                       K57.30, Diverticulosis of large intestine without                        perforation or abscess without bleeding CPT copyright 2018 American Medical Association. All rights reserved. The codes documented in this report are preliminary and upon coder review may  be revised to meet current compliance requirements.  Melodie BouillonVarnita Hobart Marte, MD Michel BickersVarnita B. Maximino Greenlandahiliani MD, MD 07/06/2018 11:38:34 AM This report has been signed electronically. Number of Addenda: 0 Note Initiated On: 07/06/2018 10:30 AM Scope Withdrawal Time: 0 hours 19 minutes  40 seconds  Total Procedure Duration: 0 hours 24 minutes 6 seconds  Estimated Blood Loss: Estimated blood loss: none.      [facility]

## 2018-07-08 ENCOUNTER — Encounter: Payer: Self-pay | Admitting: Gastroenterology

## 2018-07-08 LAB — SURGICAL PATHOLOGY

## 2018-07-12 ENCOUNTER — Other Ambulatory Visit: Payer: Self-pay

## 2018-07-12 DIAGNOSIS — K649 Unspecified hemorrhoids: Secondary | ICD-10-CM

## 2018-07-14 ENCOUNTER — Telehealth: Payer: Self-pay | Admitting: Gastroenterology

## 2018-07-14 NOTE — Telephone Encounter (Signed)
Patient is confused about a referral for hemorrhoids. He is waiting on pathology from his procedure on 12/18. Please discuss the referral with Feliz Beamravis and the banding clinic w/ Dr. Allegra LaiVanga. He is out of town until first of the year.

## 2018-07-27 ENCOUNTER — Encounter: Payer: Self-pay | Admitting: *Deleted

## 2018-08-12 NOTE — Telephone Encounter (Signed)
Pt continues to travel and will contact office in about a month to set an appt with Dr. Allegra Lai for discusion and possible hemorrhoid banding. Is aware that he may need a referral for external hemorroids. Will see Dr. Allegra Lai first.

## 2018-12-14 ENCOUNTER — Ambulatory Visit (INDEPENDENT_AMBULATORY_CARE_PROVIDER_SITE_OTHER): Payer: Managed Care, Other (non HMO) | Admitting: Family Medicine

## 2018-12-14 ENCOUNTER — Encounter: Payer: Self-pay | Admitting: Family Medicine

## 2018-12-14 ENCOUNTER — Other Ambulatory Visit: Payer: Self-pay

## 2018-12-14 DIAGNOSIS — K649 Unspecified hemorrhoids: Secondary | ICD-10-CM | POA: Diagnosis not present

## 2018-12-14 DIAGNOSIS — E7800 Pure hypercholesterolemia, unspecified: Secondary | ICD-10-CM

## 2018-12-14 MED ORDER — TRIAMCINOLONE ACETONIDE 0.1 % EX CREA: 1.0000 "application " | TOPICAL_CREAM | Freq: Two times a day (BID) | CUTANEOUS | 0 refills | Status: AC

## 2018-12-14 NOTE — Progress Notes (Signed)
   There were no vitals taken for this visit.   Subjective:    Patient ID: Julian Wolfe, male    DOB: 1965-12-24, 53 y.o.   MRN: [id number]  HPI: [name] is a 53 y.o. male  Med check  Telemedicine using audio/video telecommunications for a synchronous communication visit. Today's visit due to COVID-19 isolation precautions I connected with and verified that I am speaking with the correct person using two identifiers.   I discussed the limitations, risks, security and privacy concerns of performing an evaluation and management service by telecommunication and the availability of in person appointments. I also discussed with the patient that there may be a patient responsible charge related to this service. The patient expressed understanding and agreed to proceed. The patient's location is home. I am at home.  Bowel issues with normal colonoscopy has some hemorrhoid type issues but no significant problems.  Does have occasional bleeding but otherwise normal. Describes calf lesion with a red itching spot.  Developed over the last several days no other specific complaints. Taking cholesterol medicine without problems.   Relevant past medical, surgical, family and social history reviewed and updated as indicated. Interim medical history since our last visit reviewed. Allergies and medications reviewed and updated.  Review of Systems  Constitutional: Negative.   Respiratory: Negative.   Cardiovascular: Negative.     Per HPI unless specifically indicated above     Objective:    There were no vitals taken for this visit.  Wt Readings from Last 3 Encounters:  07/06/18 205 lb (93 kg)  06/07/18 207 lb 3.2 oz (94 kg)  05/25/18 205 lb 8 oz (93.2 kg)    Physical Exam      Assessment & Plan:   Problem List Items Addressed This Visit      Cardiovascular and Mediastinum   Hemorrhoid    Discussed care and treatment        Other   Hyperlipidemia    The current  medical regimen is effective;  continue present plan and medications.         I discussed the assessment and treatment plan with the patient. The patient was provided an opportunity to ask questions and all were answered. The patient agreed with the plan and demonstrated an understanding of the instructions.   The patient was advised to call back or seek an in-person evaluation if the symptoms worsen or if the condition fails to improve as anticipated.   I provided 21+ minutes of time during this encounter.  Follow up plan: Return in about 6 months (around 06/16/2019) for Physical Exam.

## 2018-12-14 NOTE — Assessment & Plan Note (Signed)
The current medical regimen is effective;  continue present plan and medications.  

## 2018-12-14 NOTE — Assessment & Plan Note (Signed)
Discussed care and treatment 

## 2019-04-19 ENCOUNTER — Other Ambulatory Visit: Payer: Self-pay | Admitting: Family Medicine

## 2019-04-19 DIAGNOSIS — T7840XD Allergy, unspecified, subsequent encounter: Secondary | ICD-10-CM

## 2019-04-19 NOTE — Telephone Encounter (Signed)
Requested medication (s) are due for refill today: yes  Requested medication (s) are on the active medication list: yes  Last refill:  03/14/2019  Future visit scheduled: no  Notes to clinic:  Refill cannot be delegated    Requested Prescriptions  Pending Prescriptions Disp Refills   fexofenadine-pseudoephedrine (ALLEGRA-D) 60-120 MG 12 hr tablet [Pharmacy Med Name: ALLEGRA-D 12 HOUR TABLET] 60 tablet 6    Sig: TAKE 1 TABLET BY MOUTH EVERY DAY AS NEEDED     Not Delegated - Ear, Nose, and Throat:  Combination Products with Pseudoephedrine Failed - 04/19/2019 11:18 AM      Failed - This refill cannot be delegated      Passed - Last BP in normal range    BP Readings from Last 1 Encounters:  07/06/18 100/68         Passed - Valid encounter within last 12 months    Recent Outpatient Visits          4 months ago Hemorrhoids, unspecified hemorrhoid type   [facility] Crissman, Jeannette How, MD   10 months ago Need for influenza vaccination   [facility] Guadalupe Maple, MD   1 year ago Annual physical exam   [facility] Carles Collet M, PA-C   1 year ago Hyperlipidemia, unspecified hyperlipidemia type   [facility] Crissman, Jeannette How, MD   1 year ago Pure hypercholesterolemia   [facility] Crissman, Jeannette How, MD

## 2019-04-25 ENCOUNTER — Telehealth: Payer: Self-pay

## 2019-04-25 DIAGNOSIS — Z0000 Encounter for general adult medical examination without abnormal findings: Secondary | ICD-10-CM

## 2019-04-25 NOTE — Telephone Encounter (Signed)
Patient has no been seen since 12/14/2018. Last physical was November of 2019.  Patient needs a physical appointment to obtain his labs, correct?  Routing to PCP

## 2019-04-25 NOTE — Telephone Encounter (Signed)
Copied from [place] [phone]. Topic: General - Other >> Apr 25, 2019  9:31 AM Leward Quan A wrote: Reason for CRM: Patient called to schedule his physical then changed his mind states that he would like orders for his blood work with PSA placed and a call to let him know he can go get it done Ph# [phone] (

## 2019-04-26 NOTE — Telephone Encounter (Signed)
Labs ordered and OK to schedule POE in Nov

## 2019-04-26 NOTE — Telephone Encounter (Signed)
Called patient and let him know of Dr. Jeananne Rama message. Patient stated that he will give [place] a call back to schedule his CPE.

## 2019-04-30 ENCOUNTER — Other Ambulatory Visit: Payer: Self-pay | Admitting: Family Medicine

## 2019-04-30 DIAGNOSIS — E7800 Pure hypercholesterolemia, unspecified: Secondary | ICD-10-CM

## 2019-05-01 NOTE — Telephone Encounter (Signed)
Requested medication (s) are due for refill today: yes  Requested medication (s) are on the active medication list: yes  Last refill:  03/06/2019  Future visit scheduled: no  Notes to clinic: requesting a year supply   Requested Prescriptions  Pending Prescriptions Disp Refills   lovastatin (MEVACOR) 40 MG tablet [Pharmacy Med Name: LOVASTATIN  40MG   TAB] 90 tablet 3    Sig: TAKE 1 TABLET BY MOUTH  DAILY     Cardiovascular:  Antilipid - Statins Failed - 04/30/2019  9:14 PM      Failed - Total Cholesterol in normal range and within 360 days    Cholesterol, Total  Date Value Ref Range Status  02/17/2018 182 100 - 199 mg/dL Final   Cholesterol Piccolo, Waived  Date Value Ref Range Status  11/18/2017 195 <200 mg/dL Final    Comment:                            Desirable                <200                         Borderline High      200- 239                         High                     >239          Failed - LDL in normal range and within 360 days    LDL Calculated  Date Value Ref Range Status  02/17/2018 122 (H) 0 - 99 mg/dL Final         Failed - HDL in normal range and within 360 days    HDL  Date Value Ref Range Status  02/17/2018 44 >39 mg/dL Final         Failed - Triglycerides in normal range and within 360 days    Triglycerides  Date Value Ref Range Status  02/17/2018 79 0 - 149 mg/dL Final   Triglycerides Piccolo,Waived  Date Value Ref Range Status  11/18/2017 87 <150 mg/dL Final    Comment:                            Normal                   <150                         Borderline High     150 - 199                         High                200 - 499                         Very High                >499          Passed - Patient is not pregnant      Passed - Valid encounter within last 12 months    Recent Outpatient Visits  4 months ago Hemorrhoids, unspecified hemorrhoid type   [facility] Crissman, Redge Gainer, MD   11  months ago Need for influenza vaccination   [facility] Steele Sizer, MD   1 year ago Annual physical exam   [facility] Osvaldo Angst M, PA-C   1 year ago Hyperlipidemia, unspecified hyperlipidemia type   [facility] Crissman, Redge Gainer, MD   1 year ago Pure hypercholesterolemia   [facility] Crissman, Redge Gainer, MD

## 2019-05-08 ENCOUNTER — Encounter: Payer: Managed Care, Other (non HMO) | Admitting: Family Medicine

## 2019-06-13 ENCOUNTER — Encounter: Payer: Managed Care, Other (non HMO) | Admitting: Family Medicine

## 2019-11-13 ENCOUNTER — Ambulatory Visit: Payer: Managed Care, Other (non HMO) | Attending: Internal Medicine

## 2019-11-13 DIAGNOSIS — Z23 Encounter for immunization: Secondary | ICD-10-CM

## 2019-11-13 NOTE — Progress Notes (Signed)
   Covid-19 Vaccination Clinic  Name:  Julian Wolfe    MRN: [id number] DOB: Jan 02, 1966  11/13/2019  [name] was observed post Covid-19 immunization for 15 minutes without incident. He was provided with Vaccine Information Sheet and instruction to access the V-Safe system.   [name] was instructed to call 911 with any severe reactions post vaccine: Marland Kitchen Difficulty breathing  . Swelling of face and throat  . A fast heartbeat  . A bad rash all over body  . Dizziness and weakness   Immunizations Administered    Name Date Dose VIS Date Route   Pfizer COVID-19 Vaccine 11/13/2019 11:07 AM 0.3 mL 09/13/2018 Intramuscular   Manufacturer: [employer], [employer]   Lot: [id number]   NDC: [address]-8

## 2019-12-05 ENCOUNTER — Ambulatory Visit: Payer: Managed Care, Other (non HMO) | Attending: Internal Medicine

## 2019-12-05 DIAGNOSIS — Z23 Encounter for immunization: Secondary | ICD-10-CM

## 2019-12-05 NOTE — Progress Notes (Signed)
   Covid-19 Vaccination Clinic  Name:  LUISDAVID HAMBLIN    MRN: [id number] DOB: Apr 16, 1966  12/05/2019  Mr. Boardley was observed post Covid-19 immunization for 15 minutes without incident. He was provided with Vaccine Information Sheet and instruction to access the V-Safe system.   Mr. Maldonado was instructed to call 911 with any severe reactions post vaccine: Marland Kitchen Difficulty breathing  . Swelling of face and throat  . A fast heartbeat  . A bad rash all over body  . Dizziness and weakness   Immunizations Administered    Name Date Dose VIS Date Route   Pfizer COVID-19 Vaccine 12/05/2019 10:42 AM 0.3 mL 09/13/2018 Intramuscular   Manufacturer: [employer], [employer]   Lot: [id number]   NDC: [address]-3

## 2022-05-20 ENCOUNTER — Telehealth: Payer: Self-pay

## 2022-05-20 NOTE — Telephone Encounter (Signed)
Error

## 2023-04-14 ENCOUNTER — Other Ambulatory Visit: Payer: Self-pay

## 2023-04-14 ENCOUNTER — Emergency Department
Admission: EM | Admit: 2023-04-14 | Discharge: 2023-04-15 | Disposition: A | Discharge status: Home / Self Care | Payer: BC Managed Care – PPO | Attending: Emergency Medicine | Admitting: Emergency Medicine

## 2023-04-14 ENCOUNTER — Ambulatory Visit: Payer: Self-pay | Admitting: *Deleted

## 2023-04-14 DIAGNOSIS — M542 Cervicalgia: Secondary | ICD-10-CM | POA: Diagnosis present

## 2023-04-14 DIAGNOSIS — M25519 Pain in unspecified shoulder: Secondary | ICD-10-CM | POA: Insufficient documentation

## 2023-04-14 DIAGNOSIS — J069 Acute upper respiratory infection, unspecified: Secondary | ICD-10-CM | POA: Insufficient documentation

## 2023-04-14 LAB — CBC WITH DIFFERENTIAL/PLATELET
Abs Immature Granulocytes: 0.04 10*3/uL (ref 0.00–0.07)
Basophils Absolute: 0.1 10*3/uL (ref 0.0–0.1)
Basophils Relative: 1 %
Eosinophils Absolute: 0.5 10*3/uL (ref 0.0–0.5)
Eosinophils Relative: 5 %
HCT: 42.9 % (ref 39.0–52.0)
Hemoglobin: 14.3 g/dL (ref 13.0–17.0)
Immature Granulocytes: 0 %
Lymphocytes Relative: 22 %
Lymphs Abs: 2.3 10*3/uL (ref 0.7–4.0)
MCH: 29.5 pg (ref 26.0–34.0)
MCHC: 33.3 g/dL (ref 30.0–36.0)
MCV: 88.6 fL (ref 80.0–100.0)
Monocytes Absolute: 1.2 10*3/uL — ABNORMAL HIGH (ref 0.1–1.0)
Monocytes Relative: 11 %
Neutro Abs: 6.5 10*3/uL (ref 1.7–7.7)
Neutrophils Relative %: 61 %
Platelets: 247 10*3/uL (ref 150–400)
RBC: 4.84 MIL/uL (ref 4.22–5.81)
RDW: 12.6 % (ref 11.5–15.5)
WBC: 10.5 10*3/uL (ref 4.0–10.5)
nRBC: 0 % (ref 0.0–0.2)

## 2023-04-14 LAB — COMPREHENSIVE METABOLIC PANEL
ALT: 28 U/L (ref 0–44)
AST: 21 U/L (ref 15–41)
Albumin: 3.9 g/dL (ref 3.5–5.0)
Alkaline Phosphatase: 56 U/L (ref 38–126)
Anion gap: 8 (ref 5–15)
BUN: 9 mg/dL (ref 6–20)
CO2: 26 mmol/L (ref 22–32)
Calcium: 8.8 mg/dL — ABNORMAL LOW (ref 8.9–10.3)
Chloride: 104 mmol/L (ref 98–111)
Creatinine, Ser: 0.64 mg/dL (ref 0.61–1.24)
GFR, Estimated: >60 mL/min (ref >60)
Glucose, Bld: 97 mg/dL (ref 70–99)
Potassium: 3.7 mmol/L (ref 3.5–5.1)
Sodium: 138 mmol/L (ref 135–145)
Total Bilirubin: 1.1 mg/dL (ref 0.3–1.2)
Total Protein: 7.2 g/dL (ref 6.5–8.1)

## 2023-04-14 MED ORDER — KETOROLAC TROMETHAMINE 30 MG/ML IJ SOLN
30.0000 mg | Freq: Once | INTRAMUSCULAR | Status: AC
  Administered 2023-04-15: 30 mg via INTRAMUSCULAR
  Filled 2023-04-14: qty 1

## 2023-04-14 MED ORDER — LIDOCAINE 5 % EX PTCH
1.0000 | MEDICATED_PATCH | CUTANEOUS | Status: DC
  Administered 2023-04-15: 1 via TRANSDERMAL
  Filled 2023-04-14: qty 1

## 2023-04-14 MED ORDER — OXYCODONE-ACETAMINOPHEN 5-325 MG PO TABS
1.0000 | ORAL_TABLET | Freq: Once | ORAL | Status: AC
  Administered 2023-04-14: 1 via ORAL
  Filled 2023-04-14: qty 1

## 2023-04-14 MED ORDER — ACETAMINOPHEN 325 MG PO TABS
650.0000 mg | ORAL_TABLET | Freq: Once | ORAL | Status: AC
  Administered 2023-04-15: 650 mg via ORAL
  Filled 2023-04-14: qty 2

## 2023-04-14 MED ORDER — HYDROCODONE-ACETAMINOPHEN 5-325 MG PO TABS: 1.0000 | ORAL_TABLET | Freq: Once | ORAL | Status: DC

## 2023-04-14 NOTE — ED Provider Notes (Addendum)
[facility] Provider Note    Event Date/Time   First MD Initiated Contact with Patient 04/14/23 2332     (approximate)   History   Neck Pain   HPI  Julian Wolfe is a 57 y.o. male   Past medical history of significant past medical history presents emergency department with viral URI symptoms over the last 1 week, with neck and shoulder pain and stiffness.  He denies fever.  He continues to have a hoarse voice from his recent cold-like symptoms 1 week ago that is otherwise mostly resolved.    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  There is no radiation of pain.  There is no motor or sensory deficits.   Independent Historian contributed to assessment above: His wife is at bedside to corroborate information given above and past medical history    Physical Exam   Triage Vital Signs: ED Triage Vitals  Encounter Vitals Group     BP 04/14/23 1933 (!) 155/91     Systolic BP Percentile --      Diastolic BP Percentile --      Pulse Rate 04/14/23 1933 71     Resp 04/14/23 1933 16     Temp 04/14/23 1933 97.9 F (36.6 C)     Temp Source 04/14/23 1933 Oral     SpO2 04/14/23 1933 100 %     Weight 04/14/23 1934 205 lb (93 kg)     Height 04/14/23 1934 5\' 8"  (1.727 m)     Head Circumference --      Peak Flow --      Pain Score 04/14/23 1934 8     Pain Loc --      Pain Education --      Exclude from Growth Chart --     Most recent vital signs: Vitals:   04/14/23 1933 04/15/23 0023  BP: (!) 155/91 (!) 146/78  Pulse: 71 81  Resp: 16 16  Temp: 97.9 F (36.6 C) 98 F (36.7 C)  SpO2: 100% 100%    General: Awake, no distress.  CV:  Good peripheral perfusion.  Resp:  Normal effort.  Abd:  No distention.  Other:  Well-appearing nontoxic patient in no acute distress, pleasant gentleman awake alert oriented mentation is very clear.  He is able to  range his neck and has no motor or sensory deficits and his gait is normal.  There is no cervical lymphadenopathy.  He has soreness to the right paraspinal muscles as well as the trapezius but no midline tenderness or step-offs deformities.  Able to range both arms, radial pulses intact equal bilaterally.   ED Results / Procedures / Treatments   Labs (all labs ordered are listed, but only abnormal results are displayed) Labs Reviewed  CBC WITH DIFFERENTIAL/PLATELET - Abnormal; Notable for the following components:      Result Value   Monocytes Absolute 1.2 (*)    All other components within normal limits  COMPREHENSIVE METABOLIC PANEL - Abnormal; Notable for the following components:   Calcium 8.8 (*)    All other components within normal limits     I ordered and reviewed the above labs they are notable for white blood cell count is normal   PROCEDURES:  Critical Care performed: No  Procedures   MEDICATIONS ORDERED IN ED: Medications  lidocaine (LIDODERM) 5 % 1 patch (  1 patch Transdermal Patch Applied 04/15/23 0017)  oxyCODONE-acetaminophen (PERCOCET/ROXICET) 5-325 MG per tablet 1 tablet (1 tablet Oral Given 04/14/23 2126)  ketorolac (TORADOL) 30 MG/ML injection 30 mg (30 mg Intramuscular Given 04/15/23 0018)  acetaminophen (TYLENOL) tablet 650 mg (650 mg Oral Given 04/15/23 0012)  diazepam (VALIUM) tablet 5 mg (5 mg Oral Given 04/15/23 0017)    IMPRESSION / MDM / ASSESSMENT AND PLAN / ED COURSE  I reviewed the triage vital signs and the nursing notes.                                Patient's presentation is most consistent with acute presentation with potential threat to life or bodily function.  Differential diagnosis includes, but is not limited to, cervical strain, myalgias, considered but I think less likely meningitis or dissection   The patient is on the cardiac monitor to evaluate for evidence of arrhythmia and/or significant heart rate changes.  MDM:     Patient with neck pain and trapezius soreness most likely cervical strain in the setting of exertion or myalgias due to his recent URI.  I doubt meningitis given nontoxic appearance, ability to range the neck, no fever no leukocytosis and I will defer LP at this time given risks and benefits of this procedure and discussion with patient shared decision making.  I doubt bony injury given no trauma no bony tenderness and I doubt dissection given no significant trauma noted and no associated symptoms or signs of dissection -denies headache, vision changes, neurologic signs absent on exam, no nausea vomiting.  -- Plan will be for multimodal pain control and follow-up with PMD on Friday as planned, and he will return with any new or worsening symptoms.       FINAL CLINICAL IMPRESSION(S) / ED DIAGNOSES   Final diagnoses:  Neck pain  Viral URI     Rx / DC Orders   ED Discharge Orders          Ordered    lidocaine (LIDODERM) 5 %  Every 12 hours        04/15/23 0021    methocarbamol (ROBAXIN-750) 750 MG tablet  4 times daily        04/15/23 0021             Note:  This document was prepared using Dragon voice recognition software and may include unintentional dictation errors.    Pilar Jarvis, MD 04/15/23 6644    Pilar Jarvis, MD 04/15/23 [phone]

## 2023-04-14 NOTE — Telephone Encounter (Signed)
After triage pt stated he had called the wrong practice. No longer seen at [facility].  Chief Complaint: Neck pain Symptoms: NA Frequency: NA Pertinent Negatives: Patient denies NA Disposition: [] ED /[] Urgent Care (no appt availability in office) / [] Appointment(In office/virtual)/ []  [facility]/ [] Home Care/ [] Refused Recommended Disposition /[]  Mobile Bus/ []  Follow-up with PCP Additional Notes: NA Answer Assessment - Initial Assessment Questions 1. ONSET: "When did the pain begin?"      Monday night 2. LOCATION: "Where does it hurt?"      Back of neck to shoulders 3. PATTERN "Does the pain come and go, or has it been constant since it started?"      Constant 4. SEVERITY: "How bad is the pain?"  (Scale 1-10; or mild, moderate, severe)   - NO PAIN (0): no pain or only slight stiffness    - MILD (1-3): doesn't interfere with normal activities    - MODERATE (4-7): interferes with normal activities or awakens from sleep    - SEVERE (8-10):  excruciating pain, unable to do any normal activities      5/10 5. RADIATION: "Does the pain go anywhere else, shoot into your arms?"     Shoulders 6. CORD SYMPTOMS: "Any weakness or numbness of the arms or legs?"     No 7. CAUSE: "What do you think is causing the neck pain?"     Unsure 8. NECK OVERUSE: "Any recent activities that involved turning or twisting the neck?"     MAybe at work 9. OTHER SYMPTOMS: "Do you have any other symptoms?" (e.g., headache, fever, chest pain, difficulty breathing, neck swelling)     Cough, Stiffness  Protocols used: Neck Pain or Stiffness-A-AH

## 2023-04-14 NOTE — ED Triage Notes (Signed)
Posterior neck pain from base of skull to shoulders. Reports recently had a cold/covid as well as hx of pinched nerve. Reports pain onset last night and no relief with prescribed muscle relaxers or tylenol. Also state no relief with warm compress. Pt also endorsing intermittent tingling to fingers on L hand. Denies known trauma or injury. Pt reports that pain worsens when he swallows and if he turns his head to either side. Pt ambulatory to triage. Alert and oriented breathing unlabored. Speaking in full sentences. Pt states was referred here from urgent care.

## 2023-04-14 NOTE — ED Provider Notes (Incomplete)
[facility] Provider Note    Event Date/Time   First MD Initiated Contact with Patient 04/14/23 2332     (approximate)   History   Neck Pain   HPI  Julian Wolfe is a 57 y.o. male   Past medical history of ***    Independent Historian contributed to assessment above: ***  External Medical Documents Reviewed: ***      Physical Exam   Triage Vital Signs: ED Triage Vitals  Encounter Vitals Group     BP 04/14/23 1933 (!) 155/91     Systolic BP Percentile --      Diastolic BP Percentile --      Pulse Rate 04/14/23 1933 71     Resp 04/14/23 1933 16     Temp 04/14/23 1933 97.9 F (36.6 C)     Temp Source 04/14/23 1933 Oral     SpO2 04/14/23 1933 100 %     Weight 04/14/23 1934 205 lb (93 kg)     Height 04/14/23 1934 5\' 8"  (1.727 m)     Head Circumference --      Peak Flow --      Pain Score 04/14/23 1934 8     Pain Loc --      Pain Education --      Exclude from Growth Chart --     Most recent vital signs: Vitals:   04/14/23 1933  BP: (!) 155/91  Pulse: 71  Resp: 16  Temp: 97.9 F (36.6 C)  SpO2: 100%    General: Awake, no distress. *** CV:  Good peripheral perfusion. *** Resp:  Normal effort. *** Abd:  No distention. *** Other:  ***   ED Results / Procedures / Treatments   Labs (all labs ordered are listed, but only abnormal results are displayed) Labs Reviewed  CBC WITH DIFFERENTIAL/PLATELET - Abnormal; Notable for the following components:      Result Value   Monocytes Absolute 1.2 (*)    All other components within normal limits  COMPREHENSIVE METABOLIC PANEL - Abnormal; Notable for the following components:   Calcium 8.8 (*)    All other components within normal limits     I ordered and reviewed the above labs they are notable for ***  EKG  ED ECG REPORT I, Pilar Jarvis, the attending physician, personally viewed and interpreted this ECG.   Date: 04/14/2023  EKG Time: ***  Rate: ***  Rhythm: {ekg  findings:315101}  Axis: ***  Intervals:{conduction defects:17367}  ST&T Change: ***    RADIOLOGY I independently reviewed and interpreted *** I also reviewed radiologist's formal read.   PROCEDURES:  Critical Care performed: {CriticalCareYesNo:19197::"Yes, see critical care procedure note(s)","No"}  Procedures   MEDICATIONS ORDERED IN ED: Medications  oxyCODONE-acetaminophen (PERCOCET/ROXICET) 5-325 MG per tablet 1 tablet (1 tablet Oral Given 04/14/23 2126)    External physician / consultants:  I spoke with *** regarding care plan for this patient.   IMPRESSION / MDM / ASSESSMENT AND PLAN / ED COURSE  I reviewed the triage vital signs and the nursing notes.                                Patient's presentation is most consistent with {EM COPA:27473}  Differential diagnosis includes, but is not limited to, ***   ***The patient is on the cardiac monitor to evaluate for evidence of arrhythmia and/or significant heart rate changes.  MDM:  ***  I considered hospitalization for admission or observation ***        FINAL CLINICAL IMPRESSION(S) / ED DIAGNOSES   Final diagnoses:  None     Rx / DC Orders   ED Discharge Orders     None        Note:  This document was prepared using Dragon voice recognition software and may include unintentional dictation errors.

## 2023-04-15 MED ORDER — METHOCARBAMOL 750 MG PO TABS: 750.0000 mg | ORAL_TABLET | Freq: Four times a day (QID) | ORAL | 0 refills | Status: AC

## 2023-04-15 MED ORDER — LIDOCAINE 5 % EX PTCH: 1.0000 | MEDICATED_PATCH | Freq: Two times a day (BID) | CUTANEOUS | 0 refills | Status: AC

## 2023-04-15 MED ORDER — DIAZEPAM 5 MG PO TABS
5.0000 mg | ORAL_TABLET | Freq: Once | ORAL | Status: AC
  Administered 2023-04-15: 5 mg via ORAL
  Filled 2023-04-15: qty 1

## 2023-04-15 NOTE — Discharge Instructions (Signed)
Take acetaminophen 650 mg and ibuprofen 400 mg every 6 hours for pain.  Take with food. Use lidocaine patches as directed.  Use Robaxin muscle relaxant as directed, be aware that this may make you sleepy so do not operate machinery or drive while using this medication.  See your doctor on Friday as scheduled.  As we spoke of, if you experience any new, worsening, unexpected symptoms call your doctor right away or come back to the emergency department for reevaluation.
